# Patient Record
Sex: Female | Born: 1940 | Race: White | Hispanic: No | Smoking: Never smoker
Health system: Southern US, Community
[De-identification: ages and names within clinical notes are randomized; demographics above are authoritative.]

## PROBLEM LIST (undated history)

## (undated) DIAGNOSIS — R0600 Dyspnea, unspecified: Secondary | ICD-10-CM

## (undated) DIAGNOSIS — C801 Malignant (primary) neoplasm, unspecified: Secondary | ICD-10-CM

## (undated) DIAGNOSIS — S065X9A Traumatic subdural hemorrhage with loss of consciousness of unspecified duration, initial encounter: Secondary | ICD-10-CM

## (undated) DIAGNOSIS — R51 Headache: Secondary | ICD-10-CM

## (undated) DIAGNOSIS — G5601 Carpal tunnel syndrome, right upper limb: Secondary | ICD-10-CM

## (undated) DIAGNOSIS — S065XAA Traumatic subdural hemorrhage with loss of consciousness status unknown, initial encounter: Secondary | ICD-10-CM

## (undated) DIAGNOSIS — I809 Phlebitis and thrombophlebitis of unspecified site: Secondary | ICD-10-CM

## (undated) DIAGNOSIS — I872 Venous insufficiency (chronic) (peripheral): Secondary | ICD-10-CM

## (undated) DIAGNOSIS — K219 Gastro-esophageal reflux disease without esophagitis: Secondary | ICD-10-CM

## (undated) DIAGNOSIS — I739 Peripheral vascular disease, unspecified: Secondary | ICD-10-CM

## (undated) DIAGNOSIS — Z9889 Other specified postprocedural states: Secondary | ICD-10-CM

## (undated) DIAGNOSIS — R112 Nausea with vomiting, unspecified: Secondary | ICD-10-CM

## (undated) DIAGNOSIS — N189 Chronic kidney disease, unspecified: Secondary | ICD-10-CM

## (undated) DIAGNOSIS — M1990 Unspecified osteoarthritis, unspecified site: Secondary | ICD-10-CM

## (undated) DIAGNOSIS — I499 Cardiac arrhythmia, unspecified: Secondary | ICD-10-CM

## (undated) DIAGNOSIS — R519 Headache, unspecified: Secondary | ICD-10-CM

## (undated) DIAGNOSIS — H269 Unspecified cataract: Secondary | ICD-10-CM

## (undated) DIAGNOSIS — I493 Ventricular premature depolarization: Secondary | ICD-10-CM

## (undated) DIAGNOSIS — E039 Hypothyroidism, unspecified: Secondary | ICD-10-CM

## (undated) DIAGNOSIS — R7303 Prediabetes: Secondary | ICD-10-CM

## (undated) DIAGNOSIS — G43909 Migraine, unspecified, not intractable, without status migrainosus: Secondary | ICD-10-CM

## (undated) DIAGNOSIS — E785 Hyperlipidemia, unspecified: Secondary | ICD-10-CM

## (undated) DIAGNOSIS — M5090 Cervical disc disorder, unspecified, unspecified cervical region: Secondary | ICD-10-CM

## (undated) DIAGNOSIS — I10 Essential (primary) hypertension: Secondary | ICD-10-CM

## (undated) DIAGNOSIS — D649 Anemia, unspecified: Secondary | ICD-10-CM

## (undated) HISTORY — PX: JOINT REPLACEMENT: SHX530

## (undated) HISTORY — PX: KNEE ARTHROSCOPY: SUR90

## (undated) HISTORY — PX: OTHER SURGICAL HISTORY: SHX169

## (undated) HISTORY — PX: COLONOSCOPY: SHX174

## (undated) HISTORY — PX: DILATION AND CURETTAGE OF UTERUS: SHX78

---
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
## 1998-05-20 DIAGNOSIS — C44300 Unspecified malignant neoplasm of skin of unspecified part of face: Secondary | ICD-10-CM

## 1998-05-20 DIAGNOSIS — C801 Malignant (primary) neoplasm, unspecified: Secondary | ICD-10-CM

## 1998-05-20 HISTORY — DX: Unspecified malignant neoplasm of skin of unspecified part of face: C44.300

## 1998-05-20 HISTORY — DX: Malignant (primary) neoplasm, unspecified: C80.1

## 2004-05-24 ENCOUNTER — Ambulatory Visit: Payer: Self-pay | Admitting: Unknown Physician Specialty

## 2005-05-27 ENCOUNTER — Ambulatory Visit: Payer: Self-pay | Admitting: Unknown Physician Specialty

## 2006-06-11 ENCOUNTER — Ambulatory Visit: Payer: Self-pay | Admitting: Unknown Physician Specialty

## 2007-06-18 ENCOUNTER — Ambulatory Visit: Payer: Self-pay | Admitting: Unknown Physician Specialty

## 2008-06-23 ENCOUNTER — Ambulatory Visit: Payer: Self-pay | Admitting: Unknown Physician Specialty

## 2009-05-20 HISTORY — PX: SUBDURAL HEMATOMA EVACUATION VIA CRANIOTOMY: SUR319

## 2009-06-27 ENCOUNTER — Ambulatory Visit: Payer: Self-pay | Admitting: Unknown Physician Specialty

## 2009-08-18 ENCOUNTER — Encounter (INDEPENDENT_AMBULATORY_CARE_PROVIDER_SITE_OTHER): Payer: Self-pay | Admitting: Neurosurgery

## 2009-08-18 ENCOUNTER — Inpatient Hospital Stay (HOSPITAL_COMMUNITY): Admission: AD | Admit: 2009-08-18 | Discharge: 2009-08-22 | Payer: Self-pay | Admitting: Neurosurgery

## 2009-08-18 ENCOUNTER — Emergency Department: Payer: Self-pay | Admitting: Internal Medicine

## 2009-09-11 ENCOUNTER — Encounter: Admission: RE | Admit: 2009-09-11 | Discharge: 2009-09-11 | Payer: Self-pay | Admitting: Neurosurgery

## 2009-10-24 ENCOUNTER — Encounter: Admission: RE | Admit: 2009-10-24 | Discharge: 2009-10-24 | Payer: Self-pay | Admitting: Neurosurgery

## 2010-07-02 ENCOUNTER — Ambulatory Visit: Payer: Self-pay | Admitting: Unknown Physician Specialty

## 2010-08-08 LAB — CBC
HCT: 34.6 % — ABNORMAL LOW (ref 36.0–46.0)
Hemoglobin: 11.6 g/dL — ABNORMAL LOW (ref 12.0–15.0)
MCHC: 33.5 g/dL (ref 30.0–36.0)
MCV: 89.9 fL (ref 78.0–100.0)
Platelets: 187 10*3/uL (ref 150–400)
RBC: 3.85 MIL/uL — ABNORMAL LOW (ref 3.87–5.11)
RDW: 14.1 % (ref 11.5–15.5)
WBC: 9.3 10*3/uL (ref 4.0–10.5)

## 2010-08-08 LAB — DIFFERENTIAL
Basophils Absolute: 0 10*3/uL (ref 0.0–0.1)
Basophils Relative: 0 % (ref 0–1)
Eosinophils Absolute: 0 10*3/uL (ref 0.0–0.7)
Eosinophils Relative: 0 % (ref 0–5)
Lymphocytes Relative: 5 % — ABNORMAL LOW (ref 12–46)
Lymphs Abs: 0.5 10*3/uL — ABNORMAL LOW (ref 0.7–4.0)
Monocytes Absolute: 0.1 10*3/uL (ref 0.1–1.0)
Monocytes Relative: 1 % — ABNORMAL LOW (ref 3–12)
Neutro Abs: 8.7 10*3/uL — ABNORMAL HIGH (ref 1.7–7.7)
Neutrophils Relative %: 94 % — ABNORMAL HIGH (ref 43–77)

## 2010-08-08 LAB — BASIC METABOLIC PANEL
BUN: 19 mg/dL (ref 6–23)
CO2: 23 mEq/L (ref 19–32)
Calcium: 8.5 mg/dL (ref 8.4–10.5)
Chloride: 110 mEq/L (ref 96–112)
Creatinine, Ser: 0.92 mg/dL (ref 0.4–1.2)
GFR calc Af Amer: >60 mL/min (ref >60)
GFR calc non Af Amer: >60 mL/min (ref >60)
Glucose, Bld: 146 mg/dL — ABNORMAL HIGH (ref 70–99)
Potassium: 4.3 mEq/L (ref 3.5–5.1)
Sodium: 139 mEq/L (ref 135–145)

## 2010-08-08 LAB — GLUCOSE, CAPILLARY: Glucose-Capillary: 119 mg/dL — ABNORMAL HIGH (ref 70–99)

## 2010-08-08 LAB — MRSA PCR SCREENING: MRSA by PCR: NEGATIVE

## 2010-08-08 LAB — TYPE AND SCREEN
ABO/RH(D): O POS
Antibody Screen: NEGATIVE

## 2010-08-08 LAB — ABO/RH: ABO/RH(D): O POS

## 2011-07-08 ENCOUNTER — Ambulatory Visit: Payer: Self-pay | Admitting: Unknown Physician Specialty

## 2012-07-08 ENCOUNTER — Ambulatory Visit: Payer: Self-pay | Admitting: Physician Assistant

## 2012-07-23 ENCOUNTER — Ambulatory Visit: Payer: Self-pay | Admitting: Internal Medicine

## 2012-08-24 ENCOUNTER — Ambulatory Visit: Payer: Self-pay | Admitting: Unknown Physician Specialty

## 2012-08-25 LAB — PATHOLOGY REPORT

## 2013-07-27 ENCOUNTER — Ambulatory Visit: Payer: Self-pay | Admitting: Physician Assistant

## 2014-01-04 DIAGNOSIS — I872 Venous insufficiency (chronic) (peripheral): Secondary | ICD-10-CM | POA: Insufficient documentation

## 2014-01-04 DIAGNOSIS — K219 Gastro-esophageal reflux disease without esophagitis: Secondary | ICD-10-CM | POA: Insufficient documentation

## 2014-01-04 DIAGNOSIS — I10 Essential (primary) hypertension: Secondary | ICD-10-CM | POA: Insufficient documentation

## 2014-01-04 DIAGNOSIS — E782 Mixed hyperlipidemia: Secondary | ICD-10-CM | POA: Insufficient documentation

## 2014-01-04 DIAGNOSIS — E039 Hypothyroidism, unspecified: Secondary | ICD-10-CM | POA: Insufficient documentation

## 2014-01-04 DIAGNOSIS — N289 Disorder of kidney and ureter, unspecified: Secondary | ICD-10-CM | POA: Insufficient documentation

## 2014-01-04 DIAGNOSIS — D649 Anemia, unspecified: Secondary | ICD-10-CM | POA: Insufficient documentation

## 2014-08-02 ENCOUNTER — Ambulatory Visit: Payer: Self-pay | Admitting: Physician Assistant

## 2014-10-14 DIAGNOSIS — M1711 Unilateral primary osteoarthritis, right knee: Secondary | ICD-10-CM | POA: Insufficient documentation

## 2015-05-11 ENCOUNTER — Other Ambulatory Visit: Payer: Self-pay | Admitting: Physician Assistant

## 2015-05-11 DIAGNOSIS — Z1231 Encounter for screening mammogram for malignant neoplasm of breast: Secondary | ICD-10-CM

## 2015-08-03 ENCOUNTER — Ambulatory Visit
Admission: RE | Admit: 2015-08-03 | Discharge: 2015-08-03 | Disposition: A | Discharge status: Home / Self Care | Payer: Medicare Other | Source: Ambulatory Visit | Attending: Physician Assistant | Admitting: Physician Assistant

## 2015-08-03 ENCOUNTER — Other Ambulatory Visit: Payer: Self-pay | Admitting: Physician Assistant

## 2015-08-03 DIAGNOSIS — Z1231 Encounter for screening mammogram for malignant neoplasm of breast: Secondary | ICD-10-CM | POA: Diagnosis present

## 2015-08-03 HISTORY — DX: Malignant (primary) neoplasm, unspecified: C80.1

## 2015-11-28 DIAGNOSIS — R0602 Shortness of breath: Secondary | ICD-10-CM | POA: Insufficient documentation

## 2015-11-28 DIAGNOSIS — I493 Ventricular premature depolarization: Secondary | ICD-10-CM | POA: Insufficient documentation

## 2016-05-16 ENCOUNTER — Other Ambulatory Visit: Payer: Self-pay | Admitting: Physician Assistant

## 2016-05-16 DIAGNOSIS — Z1239 Encounter for other screening for malignant neoplasm of breast: Secondary | ICD-10-CM

## 2016-08-06 ENCOUNTER — Ambulatory Visit
Admission: RE | Admit: 2016-08-06 | Discharge: 2016-08-06 | Disposition: A | Discharge status: Home / Self Care | Payer: Medicare Other | Source: Ambulatory Visit | Attending: Physician Assistant | Admitting: Physician Assistant

## 2016-08-06 ENCOUNTER — Encounter (HOSPITAL_COMMUNITY): Payer: Self-pay

## 2016-08-06 DIAGNOSIS — Z1239 Encounter for other screening for malignant neoplasm of breast: Secondary | ICD-10-CM

## 2016-08-06 DIAGNOSIS — Z1231 Encounter for screening mammogram for malignant neoplasm of breast: Secondary | ICD-10-CM | POA: Diagnosis present

## 2017-05-28 ENCOUNTER — Other Ambulatory Visit: Payer: Self-pay | Admitting: Physician Assistant

## 2017-05-28 DIAGNOSIS — Z1231 Encounter for screening mammogram for malignant neoplasm of breast: Secondary | ICD-10-CM

## 2017-06-20 HISTORY — PX: HERNIA REPAIR: SHX51

## 2017-06-27 ENCOUNTER — Encounter
Admission: RE | Admit: 2017-06-27 | Discharge: 2017-06-27 | Disposition: A | Discharge status: Home / Self Care | Payer: Medicare Other | Source: Ambulatory Visit | Attending: Surgery | Admitting: Surgery

## 2017-06-27 ENCOUNTER — Other Ambulatory Visit: Payer: Self-pay

## 2017-06-27 DIAGNOSIS — Z01810 Encounter for preprocedural cardiovascular examination: Secondary | ICD-10-CM | POA: Diagnosis present

## 2017-06-27 DIAGNOSIS — K219 Gastro-esophageal reflux disease without esophagitis: Secondary | ICD-10-CM | POA: Diagnosis not present

## 2017-06-27 DIAGNOSIS — E785 Hyperlipidemia, unspecified: Secondary | ICD-10-CM | POA: Insufficient documentation

## 2017-06-27 DIAGNOSIS — D649 Anemia, unspecified: Secondary | ICD-10-CM | POA: Insufficient documentation

## 2017-06-27 DIAGNOSIS — I10 Essential (primary) hypertension: Secondary | ICD-10-CM | POA: Insufficient documentation

## 2017-06-27 DIAGNOSIS — Z01812 Encounter for preprocedural laboratory examination: Secondary | ICD-10-CM | POA: Diagnosis not present

## 2017-06-27 DIAGNOSIS — E039 Hypothyroidism, unspecified: Secondary | ICD-10-CM | POA: Insufficient documentation

## 2017-06-27 DIAGNOSIS — Z85828 Personal history of other malignant neoplasm of skin: Secondary | ICD-10-CM | POA: Insufficient documentation

## 2017-06-27 DIAGNOSIS — I739 Peripheral vascular disease, unspecified: Secondary | ICD-10-CM | POA: Insufficient documentation

## 2017-06-27 DIAGNOSIS — M542 Cervicalgia: Secondary | ICD-10-CM | POA: Diagnosis not present

## 2017-06-27 HISTORY — DX: Anemia, unspecified: D64.9

## 2017-06-27 HISTORY — DX: Essential (primary) hypertension: I10

## 2017-06-27 HISTORY — DX: Other specified postprocedural states: Z98.890

## 2017-06-27 HISTORY — DX: Hypothyroidism, unspecified: E03.9

## 2017-06-27 HISTORY — DX: Unspecified osteoarthritis, unspecified site: M19.90

## 2017-06-27 HISTORY — DX: Cervical disc disorder, unspecified, unspecified cervical region: M50.90

## 2017-06-27 HISTORY — DX: Peripheral vascular disease, unspecified: I73.9

## 2017-06-27 HISTORY — DX: Nausea with vomiting, unspecified: R11.2

## 2017-06-27 HISTORY — DX: Hyperlipidemia, unspecified: E78.5

## 2017-06-27 HISTORY — DX: Venous insufficiency (chronic) (peripheral): I87.2

## 2017-06-27 HISTORY — DX: Gastro-esophageal reflux disease without esophagitis: K21.9

## 2017-06-27 LAB — POTASSIUM: Potassium: 3.9 mmol/L (ref 3.5–5.1)

## 2017-06-27 NOTE — Progress Notes (Signed)
EKG of 06/27/17 reviewed by Dr. Kayleen Memos (anesthesia). Recommends getting medical clearance due to PVC's showing on EKG. Clearance form faxed to Dr. Carrie Mew (patient's PCP on file)

## 2017-06-27 NOTE — Patient Instructions (Signed)
Your procedure is scheduled on: July 11, 2017 FRIDAY Report to Same Day Surgery on the 2nd floor in the [facility]. To find out your arrival time, please call [phone] between 1PM - 3PM on: July 10, 2017 THURSDAY  REMEMBER: Instructions that are not followed completely may result in serious medical risk, up to and including death; or upon the discretion of your surgeon and anesthesiologist your surgery may need to be rescheduled.  Do not eat food after midnight the night before your procedure.  No gum chewing or hard candies.  You may however, drink CLEAR liquids up to 2 hours before you are scheduled to arrive at the hospital for your procedure.  Do not drink clear liquids within 2 hours of the start of your surgery.  Clear liquids include: - water  - apple juice without pulp - clear gatorade - black coffee or tea (Do NOT add anything to the coffee or tea) Do NOT drink anything that is not on this list.  No Smoking including e-cigarettes for 24 hours prior to surgery. No chewable tobacco products for at least 6 hours prior to surgery. No nicotine patches on the day of surgery.  On the morning of surgery brush your teeth with toothpaste and water, you may rinse your mouth with mouthwash if you wish. Do not swallow any  toothpaste of mouthwash.  Notify your doctor if there is any change in your medical condition (cold, fever, infection).  Do not wear jewelry, make-up, hairpins, clips or nail polish.  Do not wear lotions, powders, or perfumes. You may NOT wear deodorant.  Do not shave 48 hours prior to surgery. Men may shave face and neck.  Contacts and dentures may not be worn into surgery.  Do not bring valuables to the hospital. [facility] is not responsible for any belongings or valuables.   TAKE THESE MEDICATIONS THE MORNING OF SURGERY WITH A SIP OF WATER: SYNTHROID OMEPRAZOLE TAKE A DOSE  THE NIGHT BEFORE SURGERY AND THE MORNING OF SURGERY  Use CHG Soap   as directed on instruction sheet.  Follow recommendations from Cardiologist, Pulmonologist or PCP regarding stopping Aspirin, Coumadin, Plavix, Eliquis, Pradaxa, or Pletal.-LAST DOSE 07-03-2017 PER DR [facility] OFFICE  Stop Anti-inflammatories such as Advil, Aleve, Ibuprofen, Motrin, Naproxen, Naprosyn, Goodie powder, or aspirin products. (May take Tylenol or Acetaminophen if needed.) LAST DOSE 2-19- 2019 PER DR SMITH  Stop ANY OVER THE COUNTER supplements until after surgery.LAST DOSE 2-14 2019 (May continue Vitamin D, Vitamin B, and multivitamin.)  If you are being discharged the day of surgery, you will not be allowed to drive home. You will need someone to drive you home and stay with you that night.   If you are taking public transportation, you will need to have a responsible adult to with you.  Please call the number above if you have any questions about these instructions.

## 2017-07-10 MED ORDER — CEFAZOLIN SODIUM-DEXTROSE 2-4 GM/100ML-% IV SOLN
2.0000 g | Freq: Once | INTRAVENOUS | Status: AC
  Administered 2017-07-11: 2 g via INTRAVENOUS

## 2017-07-11 ENCOUNTER — Ambulatory Visit: Payer: Medicare Other | Admitting: Anesthesiology

## 2017-07-11 ENCOUNTER — Ambulatory Visit
Admission: RE | Admit: 2017-07-11 | Discharge: 2017-07-11 | Disposition: A | Discharge status: Home / Self Care | Payer: Medicare Other | Source: Ambulatory Visit | Attending: Surgery | Admitting: Surgery

## 2017-07-11 ENCOUNTER — Encounter: Payer: Self-pay | Admitting: *Deleted

## 2017-07-11 ENCOUNTER — Encounter
Admission: RE | Disposition: A | Discharge status: Home / Self Care | Payer: Self-pay | Source: Ambulatory Visit | Attending: Surgery

## 2017-07-11 DIAGNOSIS — I10 Essential (primary) hypertension: Secondary | ICD-10-CM | POA: Insufficient documentation

## 2017-07-11 DIAGNOSIS — E039 Hypothyroidism, unspecified: Secondary | ICD-10-CM | POA: Insufficient documentation

## 2017-07-11 DIAGNOSIS — Z7982 Long term (current) use of aspirin: Secondary | ICD-10-CM | POA: Insufficient documentation

## 2017-07-11 DIAGNOSIS — I739 Peripheral vascular disease, unspecified: Secondary | ICD-10-CM | POA: Diagnosis not present

## 2017-07-11 DIAGNOSIS — K4090 Unilateral inguinal hernia, without obstruction or gangrene, not specified as recurrent: Secondary | ICD-10-CM | POA: Diagnosis present

## 2017-07-11 DIAGNOSIS — Z79899 Other long term (current) drug therapy: Secondary | ICD-10-CM | POA: Insufficient documentation

## 2017-07-11 DIAGNOSIS — Z85828 Personal history of other malignant neoplasm of skin: Secondary | ICD-10-CM | POA: Insufficient documentation

## 2017-07-11 DIAGNOSIS — K219 Gastro-esophageal reflux disease without esophagitis: Secondary | ICD-10-CM | POA: Diagnosis not present

## 2017-07-11 DIAGNOSIS — E785 Hyperlipidemia, unspecified: Secondary | ICD-10-CM | POA: Insufficient documentation

## 2017-07-11 HISTORY — PX: INGUINAL HERNIA REPAIR: SHX194

## 2017-07-11 SURGERY — REPAIR, HERNIA, INGUINAL, ADULT
Anesthesia: General | Site: Abdomen | Laterality: Right | Wound class: Clean

## 2017-07-11 MED ORDER — DEXAMETHASONE SODIUM PHOSPHATE 10 MG/ML IJ SOLN
INTRAMUSCULAR | Status: DC | PRN
  Administered 2017-07-11: 10 mg via INTRAVENOUS

## 2017-07-11 MED ORDER — ONDANSETRON HCL 4 MG/2ML IJ SOLN
INTRAMUSCULAR | Status: AC
  Filled 2017-07-11: qty 2

## 2017-07-11 MED ORDER — CEFAZOLIN SODIUM-DEXTROSE 2-4 GM/100ML-% IV SOLN
INTRAVENOUS | Status: AC
  Filled 2017-07-11: qty 100

## 2017-07-11 MED ORDER — ROCURONIUM BROMIDE 50 MG/5ML IV SOLN
INTRAVENOUS | Status: AC
  Filled 2017-07-11: qty 1

## 2017-07-11 MED ORDER — PROPOFOL 10 MG/ML IV BOLUS
INTRAVENOUS | Status: AC
  Filled 2017-07-11: qty 20

## 2017-07-11 MED ORDER — SODIUM CHLORIDE FLUSH 0.9 % IV SOLN
INTRAVENOUS | Status: AC
  Filled 2017-07-11: qty 10

## 2017-07-11 MED ORDER — BUPIVACAINE-EPINEPHRINE (PF) 0.5% -1:200000 IJ SOLN
INTRAMUSCULAR | Status: AC
  Filled 2017-07-11: qty 30

## 2017-07-11 MED ORDER — PROPOFOL 10 MG/ML IV BOLUS
INTRAVENOUS | Status: DC | PRN
  Administered 2017-07-11: 120 mg via INTRAVENOUS

## 2017-07-11 MED ORDER — LACTATED RINGERS IV SOLN
INTRAVENOUS | Status: DC | PRN
  Administered 2017-07-11: 10:00:00 via INTRAVENOUS

## 2017-07-11 MED ORDER — MIDAZOLAM HCL 2 MG/2ML IJ SOLN
INTRAMUSCULAR | Status: DC | PRN
  Administered 2017-07-11: 1 mg via INTRAVENOUS

## 2017-07-11 MED ORDER — HYDROCODONE-ACETAMINOPHEN 5-325 MG PO TABS: 1.0000 | ORAL_TABLET | Freq: Four times a day (QID) | ORAL | 0 refills | Status: DC | PRN

## 2017-07-11 MED ORDER — ARTIFICIAL TEARS OPHTHALMIC OINT
TOPICAL_OINTMENT | OPHTHALMIC | Status: DC | PRN
  Administered 2017-07-11: 1 via OPHTHALMIC

## 2017-07-11 MED ORDER — LIDOCAINE HCL (CARDIAC) 20 MG/ML IV SOLN
INTRAVENOUS | Status: DC | PRN
  Administered 2017-07-11: 80 mg via INTRAVENOUS

## 2017-07-11 MED ORDER — ROCURONIUM BROMIDE 100 MG/10ML IV SOLN
INTRAVENOUS | Status: DC | PRN
  Administered 2017-07-11: 10 mg via INTRAVENOUS
  Administered 2017-07-11: 40 mg via INTRAVENOUS

## 2017-07-11 MED ORDER — ARTIFICIAL TEARS OPHTHALMIC OINT
TOPICAL_OINTMENT | OPHTHALMIC | Status: AC
  Filled 2017-07-11: qty 3.5

## 2017-07-11 MED ORDER — MIDAZOLAM HCL 2 MG/2ML IJ SOLN
INTRAMUSCULAR | Status: AC
  Filled 2017-07-11: qty 2

## 2017-07-11 MED ORDER — FENTANYL CITRATE (PF) 100 MCG/2ML IJ SOLN
INTRAMUSCULAR | Status: DC | PRN
  Administered 2017-07-11: 25 ug via INTRAVENOUS
  Administered 2017-07-11: 50 ug via INTRAVENOUS
  Administered 2017-07-11: 25 ug via INTRAVENOUS

## 2017-07-11 MED ORDER — DEXAMETHASONE SODIUM PHOSPHATE 10 MG/ML IJ SOLN
INTRAMUSCULAR | Status: AC
  Filled 2017-07-11: qty 1

## 2017-07-11 MED ORDER — FENTANYL CITRATE (PF) 100 MCG/2ML IJ SOLN
25.0000 ug | INTRAMUSCULAR | Status: DC | PRN
  Administered 2017-07-11 (×3): 25 ug via INTRAVENOUS

## 2017-07-11 MED ORDER — BUPIVACAINE-EPINEPHRINE (PF) 0.5% -1:200000 IJ SOLN
INTRAMUSCULAR | Status: DC | PRN
  Administered 2017-07-11: 15 mL via PERINEURAL

## 2017-07-11 MED ORDER — PROMETHAZINE HCL 25 MG/ML IJ SOLN
6.2500 mg | INTRAMUSCULAR | Status: DC | PRN
  Administered 2017-07-11: 12.5 mg via INTRAVENOUS

## 2017-07-11 MED ORDER — KETOROLAC TROMETHAMINE 30 MG/ML IJ SOLN
INTRAMUSCULAR | Status: AC
  Filled 2017-07-11: qty 1

## 2017-07-11 MED ORDER — OXYCODONE HCL 5 MG/5ML PO SOLN: 5.0000 mg | Freq: Once | ORAL | Status: DC | PRN

## 2017-07-11 MED ORDER — OXYCODONE HCL 5 MG PO TABS: 5.0000 mg | ORAL_TABLET | Freq: Once | ORAL | Status: DC | PRN

## 2017-07-11 MED ORDER — LIDOCAINE HCL (PF) 2 % IJ SOLN
INTRAMUSCULAR | Status: AC
  Filled 2017-07-11: qty 10

## 2017-07-11 MED ORDER — FENTANYL CITRATE (PF) 100 MCG/2ML IJ SOLN
INTRAMUSCULAR | Status: AC
  Administered 2017-07-11: 25 ug via INTRAVENOUS
  Filled 2017-07-11: qty 2

## 2017-07-11 MED ORDER — MEPERIDINE HCL 50 MG/ML IJ SOLN: 6.2500 mg | INTRAMUSCULAR | Status: DC | PRN

## 2017-07-11 MED ORDER — SUGAMMADEX SODIUM 500 MG/5ML IV SOLN
INTRAVENOUS | Status: DC | PRN
  Administered 2017-07-11: 144.2 mg via INTRAVENOUS

## 2017-07-11 MED ORDER — PROMETHAZINE HCL 25 MG/ML IJ SOLN
INTRAMUSCULAR | Status: AC
  Administered 2017-07-11: 12.5 mg via INTRAVENOUS
  Filled 2017-07-11: qty 1

## 2017-07-11 MED ORDER — LACTATED RINGERS IV SOLN: INTRAVENOUS | Status: DC

## 2017-07-11 MED ORDER — SUGAMMADEX SODIUM 200 MG/2ML IV SOLN
INTRAVENOUS | Status: AC
  Filled 2017-07-11: qty 2

## 2017-07-11 MED ORDER — ONDANSETRON HCL 4 MG/2ML IJ SOLN
INTRAMUSCULAR | Status: DC | PRN
  Administered 2017-07-11: 4 mg via INTRAVENOUS

## 2017-07-11 MED ORDER — FENTANYL CITRATE (PF) 100 MCG/2ML IJ SOLN
INTRAMUSCULAR | Status: AC
  Filled 2017-07-11: qty 2

## 2017-07-11 SURGICAL SUPPLY — 31 items
BLADE CLIPPER SURG (BLADE) ×3 IMPLANT
BLADE SURG 15 STRL LF DISP TIS (BLADE) ×1 IMPLANT
BLADE SURG 15 STRL SS (BLADE) ×2
CANISTER SUCT 1200ML W/VALVE (MISCELLANEOUS) ×3 IMPLANT
CHLORAPREP W/TINT 26ML (MISCELLANEOUS) ×3 IMPLANT
DERMABOND ADVANCED (GAUZE/BANDAGES/DRESSINGS) ×2
DERMABOND ADVANCED .7 DNX12 (GAUZE/BANDAGES/DRESSINGS) ×1 IMPLANT
DRAIN PENROSE 5/8X18 LTX STRL (WOUND CARE) ×3 IMPLANT
DRAPE LAPAROTOMY 77X122 PED (DRAPES) ×3 IMPLANT
DRAPE LAPAROTOMY TRNSV 106X77 (MISCELLANEOUS) IMPLANT
ELECT REM PT RETURN 9FT ADLT (ELECTROSURGICAL) ×3
ELECTRODE REM PT RTRN 9FT ADLT (ELECTROSURGICAL) ×1 IMPLANT
GLOVE BIO SURGEON STRL SZ7 (GLOVE) ×6 IMPLANT
GLOVE BIO SURGEON STRL SZ7.5 (GLOVE) ×3 IMPLANT
GLOVE BIOGEL PI IND STRL 7.0 (GLOVE) ×1 IMPLANT
GLOVE BIOGEL PI INDICATOR 7.0 (GLOVE) ×2
GOWN STRL REUS W/ TWL LRG LVL3 (GOWN DISPOSABLE) ×3 IMPLANT
GOWN STRL REUS W/TWL LRG LVL3 (GOWN DISPOSABLE) ×6
KIT TURNOVER KIT A (KITS) ×3 IMPLANT
LABEL OR SOLS (LABEL) IMPLANT
MESH SYNTHETIC 4X6 SOFT BARD (Mesh General) ×1 IMPLANT
MESH SYNTHETIC SOFT BARD 4X6 (Mesh General) ×2 IMPLANT
NEEDLE HYPO 25X1 1.5 SAFETY (NEEDLE) ×3 IMPLANT
NS IRRIG 500ML POUR BTL (IV SOLUTION) ×3 IMPLANT
PACK BASIN MINOR ARMC (MISCELLANEOUS) ×3 IMPLANT
SUT CHROMIC 4 0 RB 1X27 (SUTURE) ×3 IMPLANT
SUT MNCRL AB 4-0 PS2 18 (SUTURE) ×3 IMPLANT
SUT SURGILON 0 30 BLK (SUTURE) ×9 IMPLANT
SUT VIC AB 4-0 SH 27 (SUTURE) ×2
SUT VIC AB 4-0 SH 27XANBCTRL (SUTURE) ×1 IMPLANT
SYR 10ML LL (SYRINGE) ×3 IMPLANT

## 2017-07-11 NOTE — Transfer of Care (Signed)
Immediate Anesthesia Transfer of Care Note  Patient: Pamela Conley  Procedure(s) Performed: HERNIA REPAIR INGUINAL ADULT (Right Abdomen)  Patient Location: PACU  Anesthesia Type:General  Level of Consciousness: sedated  Airway & Oxygen Therapy: Patient Spontanous Breathing and Patient connected to face mask oxygen  Post-op Assessment: Report given to RN and Post -op Vital signs reviewed and stable  Post vital signs: Reviewed and stable  Last Vitals:  Vitals:   07/11/17 0937  BP: (!) 168/84  Pulse: 72  Resp: 18  Temp: 36.6 C  SpO2: 100%    Last Pain:  Vitals:   07/11/17 0937  TempSrc: Oral         Complications: No apparent anesthesia complications

## 2017-07-11 NOTE — Op Note (Signed)
OPERATIVE REPORT  PREOPERATIVE DIAGNOSIS: Right inguinal hernia  POSTOPERATIVE DIAGNOSIS: Right inguinal hernia  PROCEDURE: Right inguinal hernia repair  ANESTHESIA:  General  SURGEON:  Rochel Brome M.D.  INDICATIONS: She reports bulging in the right groin.  A right inguinal hernia was demonstrated on physical exam and repair was recommended for definitive treatment.  With the patient on the operating table in the supine position the right lower quadrant was prepared with clippers and with ChloraPrep and draped in a sterile manner. A transversely oriented suprapubic incision was made and carried down through subcutaneous tissues. Electrocautery was used for hemostasis. The Scarpa's fascia was incised. The external oblique aponeurosis was incised along the course of its fibers to open the external ring.  A hernia sac was encountered and dissected free from surrounding structures.  There was a large defect in the floor of the inguinal canal.  There was a finding of pantaloon hernia with sac medial and lateral to the inferior epigastric vessels.  The portion of the sac that was lateral to the inferior epigastric vessels was delivered under the vessels and brought out medial to the vessels.  Attenuated transversalis fascia was excised.  The sac was opened.  Its continuity with the peritoneal cavity was demonstrated.  A high ligation of the sac was done with a 0 Surgilon suture ligature.  The sac was excised and was not submitted for pathology.  The stump was allowed to retract.  The repair was carried out with 0 Surgilon suturing the conjoined tendon to the shelving edge of the inguinal ligament incorporating transversalis fascia into the repair.  The last stitch obliterated the internal ring. Bard soft mesh was cut to create an oval shape and was placed over the repair. This was sutured to the repair with interrupted 0 Surgilon sutures and also sutured medially to the deep fascia.   The cut edges of the  external oblique aponeurosis were closed with a running 4-0 Vicryl suture.  The deep fascia superior and lateral to the repair site was infiltrated with half percent Sensorcaine with epinephrine. Subcutaneous tissues were also infiltrated. The Scarpa's fascia was closed with interrupted 4-0 Vicryl sutures. The skin was closed with running 4-0 Monocryl subcuticular suture and LiquiBand. The testicle remained in the scrotum  The patient appeared to be in satisfactory condition and was prepared for transfer to the recovery room.  Rochel Brome M.D.

## 2017-07-11 NOTE — Anesthesia Post-op Follow-up Note (Signed)
Anesthesia QCDR form completed.        

## 2017-07-11 NOTE — Anesthesia Postprocedure Evaluation (Signed)
Anesthesia Post Note  Patient: Pamela Conley  Procedure(s) Performed: HERNIA REPAIR INGUINAL ADULT (Right Abdomen)  Patient location during evaluation: PACU Anesthesia Type: General Level of consciousness: awake and alert and oriented Pain management: pain level controlled Vital Signs Assessment: post-procedure vital signs reviewed and stable Respiratory status: spontaneous breathing, nonlabored ventilation and respiratory function stable Cardiovascular status: blood pressure returned to baseline and stable Postop Assessment: no signs of nausea or vomiting Anesthetic complications: no     Last Vitals:  Vitals:   07/11/17 1323 07/11/17 1414  BP: (!) 159/84 134/65  Pulse: 69 66  Resp: 18   Temp: 37.4 C   SpO2: 98% 99%    Last Pain:  Vitals:   07/11/17 1414  TempSrc:   PainSc: 3                  Talon Regala

## 2017-07-11 NOTE — H&P (Signed)
Pamela Conley is an 77 y.o. female.   Chief Complaint: Bulging in the right groin HPI: She reports a history of bulging in the right groin which was noticed when she was standing up but generally resolves when she lies down.  She was seen in the office on January 6 and scheduled for surgery.  She reports no pain at the site.  Past Medical History:  Diagnosis Date  . Anemia   . Arthritis   . Cancer ([place])    skin  . 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 GERD (gastroesophageal reflux disease)   . Hyperlipidemia   . Hypertension   . Hypothyroidism   . Peripheral vascular disease ([place])   . PONV (postoperative nausea and vomiting)   . Venous (peripheral) insufficiency   Does have history of mild mitral regurgitation and mild tricuspid regurgitation.  Past Surgical History:  Procedure Laterality Date  . APPENDECTOMY    . COLONOSCOPY    . DILATION AND CURETTAGE OF UTERUS    . edg    . KNEE ARTHROSCOPY Right   . skin cancer removed from face    . SUBDURAL HEMATOMA EVACUATION VIA CRANIOTOMY      Family History  Problem Relation Age of Onset  . Breast cancer Neg Hx    Social History:  reports that  has never smoked. she has never used smokeless tobacco. She reports that she does not drink alcohol or use drugs.  Allergies:  Allergies  Allergen Reactions  . Esomeprazole Magnesium Hives    Medications Prior to Admission  Medication Sig Dispense Refill  . acetaminophen (TYLENOL) 500 MG tablet Take 500 mg by mouth every 6 (six) hours as needed for moderate pain or headache.    Marland Kitchen aspirin EC 81 MG tablet Take 81 mg by mouth daily.    . Cholecalciferol (VITAMIN D3) 1000 units CAPS Take 1,000 Units by mouth 2 (two) times daily.    Marland Kitchen etodolac (LODINE) 400 MG tablet Take 400 mg by mouth daily.    Marland Kitchen levothyroxine (SYNTHROID, LEVOTHROID) 75 MCG tablet Take 75 mcg by mouth daily before breakfast.    . Multiple Vitamins-Calcium (ONE-A-DAY WOMENS PO) Take 1 tablet by mouth  daily.    Marland Kitchen omeprazole (PRILOSEC) 20 MG capsule Take 20 mg by mouth daily.    Vladimir Faster Glycol-Propyl Glycol (SYSTANE OP) Place 1 drop into both eyes daily.    Vladimir Faster Glycol-Propyl Glycol (SYSTANE) 0.4-0.3 % GEL ophthalmic gel Place 1 application into both eyes at bedtime.    . ramipril (ALTACE) 2.5 MG capsule Take 2.5 mg by mouth daily.    Marland Kitchen triamterene-hydrochlorothiazide (MAXZIDE-25) 37.5-25 MG tablet Take 1 tablet by mouth daily.    . Calcium Carb-Cholecalciferol (CALCIUM-VITAMIN D) 500-200 MG-UNIT tablet Take 1 tablet by mouth 2 (two) times daily.    . Probiotic Product (PROBIOTIC DAILY PO) Take 1 capsule by mouth daily.    She has been off her aspirin since February 14  ROS she reports no recent acute illness such as cough cold or sore throat.  She does have dry eyes and uses eyedrops.  She reports no swollen glands, no heartburn.  She reports no dyspnea on exertion, no chest pains.  She reports occasional mild degree of swelling in her ankles but none in recent days.  Review of systems otherwise negative.  Blood pressure (!) 168/84, pulse 72, temperature 97.8 F (36.6 C), temperature source Oral, resp. rate 18, height 5\' 4"  (1.626 m), weight  72.1 kg (159 lb), SpO2 100 %. Physical Exam GENERAL: She is awake alert and oriented resting on the stretcher.  HEENT: Pupils equal reactive to light, sclera clear, extraocular movements intact.  She does frequently blink.  Pharynx clear.  NECK: With no palpable adenopathy and no jugular venous distention.  CARDIAC: Normal S1-S2 without murmur with occasional premature contraction  LUNGS: Clear to auscultation with no rales rhonchi or wheezes.  ABDOMEN: Soft flat nontender.  The right inguinal hernia is currently reduced.  EXTREMITIES: With no dependent edema  NEUROLOGICAL: Awake alert oriented and moving all extremities.  CLINICAL DATA: She had metabolic panel C which was normal with a creatinine of 0.8 and a total bilirubin of 0.6.   CBC was normal with hemoglobin of 13.1 and platelet count 198,000.   Assessment/Plan Right inguinal hernia  I discussed right inguinal hernia repair discussed the operation and care risk and benefits.  Plan preoperative prophylactic Kefzol.  Rochel Brome, MD 07/11/2017, 10:10 AM

## 2017-07-11 NOTE — Anesthesia Preprocedure Evaluation (Signed)
Anesthesia Evaluation  Patient identified by MRN, date of birth, ID band Patient awake    Reviewed: Allergy & Precautions, NPO status , Patient's Chart, lab work & pertinent test results  History of Anesthesia Complications (+) PONV and history of anesthetic complications  Airway Mallampati: III  TM Distance: >3 FB Neck ROM: Full  Mouth opening: Limited Mouth Opening  Dental no notable dental hx.    Pulmonary neg pulmonary ROS, neg sleep apnea, neg COPD,    breath sounds clear to auscultation- rhonchi (-) wheezing      Cardiovascular hypertension, Pt. on medications (-) CAD, (-) Past MI and (-) Cardiac Stents  Rhythm:Regular Rate:Normal - Systolic murmurs and - Diastolic murmurs    Neuro/Psych Hx of subdural hematoma requiring surgical evacuation negative psych ROS   GI/Hepatic Neg liver ROS, GERD  ,  Endo/Other  neg diabetesHypothyroidism   Renal/GU negative Renal ROS     Musculoskeletal  (+) Arthritis ,   Abdominal (+) - obese,   Peds  Hematology  (+) anemia ,   Anesthesia Other Findings Past Medical History: No date: Anemia No date: Arthritis No date: Cancer ([place])     Comment:  skin No date: 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 No date: GERD (gastroesophageal reflux disease) No date: Hyperlipidemia No date: Hypertension No date: Hypothyroidism No date: Peripheral vascular disease ([facility]) No date: PONV (postoperative nausea and vomiting) No date: Venous (peripheral) insufficiency   Reproductive/Obstetrics                             Anesthesia Physical Anesthesia Plan  ASA: III  Anesthesia Plan: General   Post-op Pain Management:    Induction: Intravenous  PONV Risk Score and Plan: 3 and Ondansetron, Dexamethasone and Midazolam  Airway Management Planned: Oral ETT  Additional Equipment:   Intra-op Plan:   Post-operative Plan: Extubation in OR  Informed  Consent: I have reviewed the patients History and Physical, chart, labs and discussed the procedure including the risks, benefits and alternatives for the proposed anesthesia with the patient or authorized representative who has indicated his/her understanding and acceptance.   Dental advisory given  Plan Discussed with: CRNA and Anesthesiologist  Anesthesia Plan Comments:         Anesthesia Quick Evaluation

## 2017-07-11 NOTE — Progress Notes (Signed)
Dr. Tamala Julian into see

## 2017-07-11 NOTE — Discharge Instructions (Addendum)
Take Tylenol or Norco if needed for pain. ° °Should not drive or do anything dangerous when taking Norco. ° °May shower and blot dry. ° °Avoid straining and heavy lifting. ° °AMBULATORY SURGERY  °DISCHARGE INSTRUCTIONS ° ° °1) The drugs that you were given will stay in your system until tomorrow so for the next 24 hours you should not: ° °A) Drive an automobile °B) Make any legal decisions °C) Drink any alcoholic beverage ° ° °2) You may resume regular meals tomorrow.  Today it is better to start with liquids and gradually work up to solid foods. ° °You may eat anything you prefer, but it is better to start with liquids, then soup and crackers, and gradually work up to solid foods. ° ° °3) Please notify your doctor immediately if you have any unusual bleeding, trouble breathing, redness and pain at the surgery site, drainage, fever, or pain not relieved by medication. ° °4) Additional Instructions: ° ° °Please contact your physician with any problems or Same Day Surgery at [phone], Monday through Friday 6 am to 4 pm, or [place] at [place] Main number at [phone]. °

## 2017-07-11 NOTE — Anesthesia Procedure Notes (Signed)
Procedure Name: Intubation Date/Time: 07/11/2017 10:27 AM Performed by: Carron Curie, CRNA Pre-anesthesia Checklist: Patient identified, Emergency Drugs available, Suction available, Patient being monitored and Timeout performed Patient Re-evaluated:Patient Re-evaluated prior to induction Oxygen Delivery Method: Circle system utilized Preoxygenation: Pre-oxygenation with 100% oxygen Induction Type: IV induction Ventilation: Mask ventilation without difficulty Laryngoscope Size: Mac and 3 Grade View: Grade III Tube type: Oral Number of attempts: 1 Airway Equipment and Method: Stylet

## 2017-08-20 ENCOUNTER — Ambulatory Visit
Admission: RE | Admit: 2017-08-20 | Discharge: 2017-08-20 | Disposition: A | Discharge status: Home / Self Care | Payer: Medicare Other | Source: Ambulatory Visit | Attending: Physician Assistant | Admitting: Physician Assistant

## 2017-08-20 DIAGNOSIS — Z1231 Encounter for screening mammogram for malignant neoplasm of breast: Secondary | ICD-10-CM | POA: Insufficient documentation

## 2017-09-03 ENCOUNTER — Other Ambulatory Visit: Payer: Self-pay | Admitting: Nurse Practitioner

## 2017-09-03 DIAGNOSIS — R140 Abdominal distension (gaseous): Secondary | ICD-10-CM

## 2017-09-03 DIAGNOSIS — R143 Flatulence: Secondary | ICD-10-CM

## 2017-09-05 ENCOUNTER — Ambulatory Visit: Payer: Medicare Other

## 2017-09-23 ENCOUNTER — Encounter: Payer: Self-pay | Admitting: *Deleted

## 2017-09-24 ENCOUNTER — Ambulatory Visit: Payer: Medicare Other | Admitting: Anesthesiology

## 2017-09-24 ENCOUNTER — Encounter
Admission: RE | Disposition: A | Discharge status: Home / Self Care | Payer: Self-pay | Source: Ambulatory Visit | Attending: Unknown Physician Specialty

## 2017-09-24 ENCOUNTER — Ambulatory Visit
Admission: RE | Admit: 2017-09-24 | Discharge: 2017-09-24 | Disposition: A | Discharge status: Home / Self Care | Payer: Medicare Other | Source: Ambulatory Visit | Attending: Unknown Physician Specialty | Admitting: Unknown Physician Specialty

## 2017-09-24 ENCOUNTER — Encounter: Payer: Self-pay | Admitting: *Deleted

## 2017-09-24 DIAGNOSIS — I129 Hypertensive chronic kidney disease with stage 1 through stage 4 chronic kidney disease, or unspecified chronic kidney disease: Secondary | ICD-10-CM | POA: Diagnosis not present

## 2017-09-24 DIAGNOSIS — K219 Gastro-esophageal reflux disease without esophagitis: Secondary | ICD-10-CM | POA: Insufficient documentation

## 2017-09-24 DIAGNOSIS — M1990 Unspecified osteoarthritis, unspecified site: Secondary | ICD-10-CM | POA: Diagnosis not present

## 2017-09-24 DIAGNOSIS — Z888 Allergy status to other drugs, medicaments and biological substances status: Secondary | ICD-10-CM | POA: Insufficient documentation

## 2017-09-24 DIAGNOSIS — I739 Peripheral vascular disease, unspecified: Secondary | ICD-10-CM | POA: Insufficient documentation

## 2017-09-24 DIAGNOSIS — D120 Benign neoplasm of cecum: Secondary | ICD-10-CM | POA: Insufficient documentation

## 2017-09-24 DIAGNOSIS — Z79899 Other long term (current) drug therapy: Secondary | ICD-10-CM | POA: Insufficient documentation

## 2017-09-24 DIAGNOSIS — N189 Chronic kidney disease, unspecified: Secondary | ICD-10-CM | POA: Insufficient documentation

## 2017-09-24 DIAGNOSIS — Z7982 Long term (current) use of aspirin: Secondary | ICD-10-CM | POA: Insufficient documentation

## 2017-09-24 DIAGNOSIS — E039 Hypothyroidism, unspecified: Secondary | ICD-10-CM | POA: Diagnosis not present

## 2017-09-24 DIAGNOSIS — Z8371 Family history of colonic polyps: Secondary | ICD-10-CM | POA: Diagnosis not present

## 2017-09-24 DIAGNOSIS — K5730 Diverticulosis of large intestine without perforation or abscess without bleeding: Secondary | ICD-10-CM | POA: Diagnosis not present

## 2017-09-24 DIAGNOSIS — K640 First degree hemorrhoids: Secondary | ICD-10-CM | POA: Insufficient documentation

## 2017-09-24 DIAGNOSIS — Z85828 Personal history of other malignant neoplasm of skin: Secondary | ICD-10-CM | POA: Insufficient documentation

## 2017-09-24 DIAGNOSIS — E785 Hyperlipidemia, unspecified: Secondary | ICD-10-CM | POA: Diagnosis not present

## 2017-09-24 DIAGNOSIS — I872 Venous insufficiency (chronic) (peripheral): Secondary | ICD-10-CM | POA: Diagnosis not present

## 2017-09-24 DIAGNOSIS — Z1211 Encounter for screening for malignant neoplasm of colon: Secondary | ICD-10-CM | POA: Insufficient documentation

## 2017-09-24 HISTORY — PX: COLONOSCOPY WITH PROPOFOL: SHX5780

## 2017-09-24 HISTORY — DX: Chronic kidney disease, unspecified: N18.9

## 2017-09-24 HISTORY — DX: Phlebitis and thrombophlebitis of unspecified site: I80.9

## 2017-09-24 HISTORY — DX: Venous insufficiency (chronic) (peripheral): I87.2

## 2017-09-24 HISTORY — DX: Traumatic subdural hemorrhage with loss of consciousness of unspecified duration, initial encounter: S06.5X9A

## 2017-09-24 HISTORY — DX: Traumatic subdural hemorrhage with loss of consciousness status unknown, initial encounter: S06.5XAA

## 2017-09-24 SURGERY — COLONOSCOPY WITH PROPOFOL
Anesthesia: General

## 2017-09-24 MED ORDER — PROPOFOL 500 MG/50ML IV EMUL
INTRAVENOUS | Status: AC
  Filled 2017-09-24: qty 50

## 2017-09-24 MED ORDER — LIDOCAINE HCL (PF) 2 % IJ SOLN
INTRAMUSCULAR | Status: AC
  Filled 2017-09-24: qty 10

## 2017-09-24 MED ORDER — SODIUM CHLORIDE 0.9 % IV SOLN: INTRAVENOUS | Status: DC

## 2017-09-24 MED ORDER — PROPOFOL 10 MG/ML IV BOLUS
INTRAVENOUS | Status: DC | PRN
  Administered 2017-09-24: 100 mg via INTRAVENOUS

## 2017-09-24 MED ORDER — FENTANYL CITRATE (PF) 100 MCG/2ML IJ SOLN
INTRAMUSCULAR | Status: DC | PRN
  Administered 2017-09-24 (×2): 50 ug via INTRAVENOUS

## 2017-09-24 MED ORDER — FENTANYL CITRATE (PF) 100 MCG/2ML IJ SOLN
INTRAMUSCULAR | Status: AC
  Filled 2017-09-24: qty 2

## 2017-09-24 MED ORDER — PROPOFOL 500 MG/50ML IV EMUL
INTRAVENOUS | Status: DC | PRN
  Administered 2017-09-24: 140 ug/kg/min via INTRAVENOUS

## 2017-09-24 MED ORDER — SODIUM CHLORIDE 0.9 % IV SOLN
INTRAVENOUS | Status: DC
  Administered 2017-09-24 (×2): via INTRAVENOUS

## 2017-09-24 MED ORDER — LIDOCAINE 2% (20 MG/ML) 5 ML SYRINGE
INTRAMUSCULAR | Status: DC | PRN
  Administered 2017-09-24: 30 mg via INTRAVENOUS

## 2017-09-24 NOTE — H&P (Signed)
Primary Care Physician:  Marinda Elk, MD Primary Gastroenterologist:  Dr. Vira Agar  Pre-Procedure History & Physical: HPI:  Pamela Conley is a 77 y.o. female is here for an colonoscopy.  Done for family history of colon polyps.  Past Medical History:  Diagnosis Date  . Anemia   . Arthritis   . Cancer ([place])    skin  . 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 Chronic kidney disease   . GERD (gastroesophageal reflux disease)   . Hyperlipidemia   . Hypertension   . Hypothyroidism   . Peripheral vascular disease ([place])   . PONV (postoperative nausea and vomiting)   . Subdural hematoma ([place])   . Superficial thrombophlebitis   . Venous (peripheral) insufficiency   . Venous insufficiency     Past Surgical History:  Procedure Laterality Date  . APPENDECTOMY    . COLONOSCOPY    . DILATION AND CURETTAGE OF UTERUS    . edg    . HERNIA REPAIR    . INGUINAL HERNIA REPAIR Right 07/11/2017   Procedure: HERNIA REPAIR INGUINAL ADULT;  Surgeon: Leonie Green, MD;  Location: [facility] ORS;  Service: General;  Laterality: Right;  . KNEE ARTHROSCOPY Right   . skin cancer removed from face    . SUBDURAL HEMATOMA EVACUATION VIA CRANIOTOMY      Prior to Admission medications   Medication Sig Start Date End Date Taking? Authorizing Provider  aspirin EC 81 MG tablet Take 81 mg by mouth daily.   Yes [provider]  Calcium Carb-Cholecalciferol (CALCIUM-VITAMIN D) 500-200 MG-UNIT tablet Take 1 tablet by mouth 2 (two) times daily.   Yes [provider]  Cholecalciferol (VITAMIN D3) 1000 units CAPS Take 1,000 Units by mouth 2 (two) times daily.   Yes [provider]  levothyroxine (SYNTHROID, LEVOTHROID) 75 MCG tablet Take 75 mcg by mouth daily before breakfast.   Yes [provider]  Multiple Vitamins-Calcium (ONE-A-DAY WOMENS PO) Take 1 tablet by mouth daily.   Yes [provider]  omeprazole (PRILOSEC) 20 MG capsule Take 20  mg by mouth daily.   Yes [provider]  Polyethyl Glycol-Propyl Glycol (SYSTANE OP) Place 1 drop into both eyes daily.   Yes [provider]  Probiotic Product (PROBIOTIC DAILY PO) Take 1 capsule by mouth daily.   Yes [provider]  ramipril (ALTACE) 2.5 MG capsule Take 2.5 mg by mouth daily.   Yes [provider]  simethicone (MYLICON) 80 MG chewable tablet Chew 80 mg by mouth every 6 (six) hours as needed for flatulence.   Yes [provider]  triamterene-hydrochlorothiazide (MAXZIDE-25) 37.5-25 MG tablet Take 1 tablet by mouth daily.   Yes [provider]  acetaminophen (TYLENOL) 500 MG tablet Take 500 mg by mouth every 6 (six) hours as needed for moderate pain or headache.    [provider]  etodolac (LODINE) 400 MG tablet Take 400 mg by mouth daily.    [provider]  HYDROcodone-acetaminophen (NORCO) 5-325 MG tablet Take 1-2 tablets by mouth every 6 (six) hours as needed for moderate pain. 07/11/17   Leonie Green, MD  Polyethyl Glycol-Propyl Glycol (SYSTANE) 0.4-0.3 % GEL ophthalmic gel Place 1 application into both eyes at bedtime.    [provider]    Allergies as of 09/18/2017 - Review Complete 07/11/2017  Allergen Reaction Noted  . Esomeprazole magnesium Hives 11/09/2013    Family History  Problem Relation Age of Onset  .  Breast cancer Neg Hx     Social History   Socioeconomic History  . Marital status: Married    Spouse name: Not on file  . Number of children: Not on file  . Years of education: Not on file  . Highest education level: Not on file  Occupational History  . Not on file  Social Needs  . Financial resource strain: Not on file  . Food insecurity:    Worry: Not on file    Inability: Not on file  . Transportation needs:    Medical: Not on file    Non-medical: Not on file  Tobacco Use  . Smoking status: Never Smoker  . Smokeless tobacco: Never Used  Substance and  Sexual Activity  . Alcohol use: No    Frequency: Never  . Drug use: No  . Sexual activity: Not Currently  Lifestyle  . Physical activity:    Days per week: Not on file    Minutes per session: Not on file  . Stress: Not on file  Relationships  . Social connections:    Talks on phone: Not on file    Gets together: Not on file    Attends religious service: Not on file    Active member of club or organization: Not on file    Attends meetings of clubs or organizations: Not on file    Relationship status: Not on file  . Intimate partner violence:    Fear of current or ex partner: Not on file    Emotionally abused: Not on file    Physically abused: Not on file    Forced sexual activity: Not on file  Other Topics Concern  . Not on file  Social History Narrative  . Not on file    Review of Systems: See HPI, otherwise negative ROS  Physical Exam: BP (!) 156/90   Pulse 80   Temp (!) 97.2 F (36.2 C) (Tympanic)   Resp 16   Ht 5\' 4"  (1.626 m)   Wt 72.1 kg (159 lb)   SpO2 100%   BMI 27.29 kg/m  General:   Alert,  pleasant and cooperative in NAD Head:  Normocephalic and atraumatic. Neck:  Supple; no masses or thyromegaly. Lungs:  Clear throughout to auscultation.    Heart:  Regular rate and rhythm. Abdomen:  Soft, nontender and nondistended. Normal bowel sounds, without guarding, and without rebound.   Neurologic:  Alert and  oriented x4;  grossly normal neurologically.  Impression/Plan: Pamela Conley is here for an colonoscopy to be performed for FH colon polyps  Risks, benefits, limitations, and alternatives regarding  colonoscopy have been reviewed with the patient.  Questions have been answered.  All parties agreeable.   Gaylyn Cheers, MD  09/24/2017, 11:57 AM

## 2017-09-24 NOTE — Transfer of Care (Signed)
Immediate Anesthesia Transfer of Care Note  Patient: Pamela Conley  Procedure(s) Performed: COLONOSCOPY WITH PROPOFOL (N/A )  Patient Location: PACU and Endoscopy Unit  Anesthesia Type:General  Level of Consciousness: awake, drowsy and patient cooperative  Airway & Oxygen Therapy: Patient Spontanous Breathing and Patient connected to nasal cannula oxygen  Post-op Assessment: Report given to RN and Post -op Vital signs reviewed and stable  Post vital signs: Reviewed and stable  Last Vitals:  Vitals Value Taken Time  BP    Temp    Pulse 76 09/24/2017 12:27 PM  Resp 16 09/24/2017 12:27 PM  SpO2 98 % 09/24/2017 12:27 PM  Vitals shown include unvalidated device data.  Last Pain:  Vitals:   09/24/17 1034  TempSrc: Tympanic         Complications: No apparent anesthesia complications

## 2017-09-24 NOTE — Anesthesia Preprocedure Evaluation (Signed)
Anesthesia Evaluation  Patient identified by MRN, date of birth, ID band Patient awake    Reviewed: Allergy & Precautions, H&P , NPO status , Patient's Chart, lab work & pertinent test results, reviewed documented beta blocker date and time   History of Anesthesia Complications (+) PONV and history of anesthetic complications  Airway Mallampati: II   Neck ROM: full    Dental  (+) Poor Dentition   Pulmonary neg pulmonary ROS,    Pulmonary exam normal        Cardiovascular hypertension, On Medications + Peripheral Vascular Disease  negative cardio ROS Normal cardiovascular exam Rhythm:regular Rate:Normal     Neuro/Psych negative neurological ROS  negative psych ROS   GI/Hepatic negative GI ROS, Neg liver ROS, GERD  ,  Endo/Other  negative endocrine ROSHypothyroidism   Renal/GU Renal diseasenegative Renal ROS  negative genitourinary   Musculoskeletal   Abdominal   Peds  Hematology negative hematology ROS (+) anemia ,   Anesthesia Other Findings Past Medical History: No date: Anemia No date: Arthritis No date: Cancer (HCC)     Comment:  skin No date: 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 No date: Chronic kidney disease No date: GERD (gastroesophageal reflux disease) No date: Hyperlipidemia No date: Hypertension No date: Hypothyroidism No date: Peripheral vascular disease (HCC) No date: PONV (postoperative nausea and vomiting) No date: Subdural hematoma (HCC) No date: Superficial thrombophlebitis No date: Venous (peripheral) insufficiency No date: Venous insufficiency Past Surgical History: No date: APPENDECTOMY No date: COLONOSCOPY No date: DILATION AND CURETTAGE OF UTERUS No date: edg No date: HERNIA REPAIR 07/11/2017: INGUINAL HERNIA REPAIR; Right     Comment:  Procedure: HERNIA REPAIR INGUINAL ADULT;  Surgeon:               Leonie Green, MD;  Location: [facility] ORS;  Service:         General;  Laterality: Right; No date: KNEE ARTHROSCOPY; Right No date: skin cancer removed from face No date: SUBDURAL HEMATOMA EVACUATION VIA CRANIOTOMY BMI    Body Mass Index:  27.29 kg/m     Reproductive/Obstetrics negative OB ROS                             Anesthesia Physical Anesthesia Plan  ASA: III  Anesthesia Plan: General   Post-op Pain Management:    Induction:   PONV Risk Score and Plan:   Airway Management Planned:   Additional Equipment:   Intra-op Plan:   Post-operative Plan:   Informed Consent: I have reviewed the patients History and Physical, chart, labs and discussed the procedure including the risks, benefits and alternatives for the proposed anesthesia with the patient or authorized representative who has indicated his/her understanding and acceptance.   Dental Advisory Given  Plan Discussed with: CRNA  Anesthesia Plan Comments:         Anesthesia Quick Evaluation

## 2017-09-24 NOTE — Anesthesia Post-op Follow-up Note (Signed)
Anesthesia QCDR form completed.        

## 2017-09-24 NOTE — Anesthesia Postprocedure Evaluation (Signed)
Anesthesia Post Note  Patient: Pamela Conley  Procedure(s) Performed: COLONOSCOPY WITH PROPOFOL (N/A )  Patient location during evaluation: PACU Anesthesia Type: General Level of consciousness: awake and alert Pain management: pain level controlled Vital Signs Assessment: post-procedure vital signs reviewed and stable Respiratory status: spontaneous breathing, nonlabored ventilation, respiratory function stable and patient connected to nasal cannula oxygen Cardiovascular status: blood pressure returned to baseline and stable Postop Assessment: no apparent nausea or vomiting Anesthetic complications: no     Last Vitals:  Vitals:   09/24/17 1238 09/24/17 1248  BP: 130/78 140/77  Pulse: 74 80  Resp: 14 17  Temp:    SpO2: 99% 100%    Last Pain:  Vitals:   09/24/17 1248  TempSrc:   PainSc: 0-No pain                 Molli Barrows

## 2017-09-24 NOTE — Op Note (Signed)
[facility] Patient Name: Pamela Conley Procedure Date: 09/24/2017 11:59 AM MRN: [id number] Account #: [id number] Date of Birth: Sep 18, 1940 Admit Type: Outpatient Age: 77 Room: [facility] ENDO ROOM 3 Gender: Female Note Status: Finalized Procedure:            Colonoscopy Indications:          Family history of colonic polyps in a first-degree                        relative Providers:            Manya Silvas, MD Referring MD:         Precious Bard, MD (Referring MD) Medicines:            Propofol per Anesthesia Complications:        No immediate complications. Procedure:            Pre-Anesthesia Assessment:                       - After reviewing the risks and benefits, the patient                        was deemed in satisfactory condition to undergo the                        procedure.                       After obtaining informed consent, the colonoscope was                        passed under direct vision. Throughout the procedure,                        the patient's blood pressure, pulse, and oxygen                        saturations were monitored continuously. The                        Colonoscope was introduced through the anus and                        advanced to the the cecum, identified by appendiceal                        orifice and ileocecal valve. The colonoscopy was                        performed without difficulty. The patient tolerated the                        procedure well. The quality of the bowel preparation                        was good. Findings:      Two sessile polyps were found in the cecum. The polyps were diminutive       in size. These polyps were removed with a jumbo cold forceps. Resection       and retrieval were complete.      Many small-mouthed diverticula were  found in the sigmoid colon and       descending colon.      Internal hemorrhoids were found during endoscopy. The hemorrhoids  were       medium-sized and Grade I (internal hemorrhoids that do not prolapse).      The exam was otherwise without abnormality. Impression:           - Two diminutive polyps in the cecum, removed with a                        jumbo cold forceps. Resected and retrieved.                       - Diverticulosis in the sigmoid colon and in the                        descending colon.                       - Internal hemorrhoids.                       - The examination was otherwise normal. Recommendation:       - Await pathology results. Manya Silvas, MD 09/24/2017 12:26:51 PM This report has been signed electronically. Number of Addenda: 0 Note Initiated On: 09/24/2017 11:59 AM Scope Withdrawal Time: 0 hours 11 minutes 11 seconds  Total Procedure Duration: 0 hours 16 minutes 41 seconds       [facility]

## 2017-09-25 ENCOUNTER — Encounter: Payer: Self-pay | Admitting: Unknown Physician Specialty

## 2017-09-25 LAB — SURGICAL PATHOLOGY

## 2017-10-08 ENCOUNTER — Inpatient Hospital Stay: Admission: RE | Admit: 2017-10-08 | Payer: Medicare Other | Source: Ambulatory Visit

## 2017-11-26 ENCOUNTER — Encounter
Admission: RE | Admit: 2017-11-26 | Discharge: 2017-11-26 | Disposition: A | Discharge status: Home / Self Care | Payer: Medicare Other | Source: Ambulatory Visit | Attending: Orthopedic Surgery | Admitting: Orthopedic Surgery

## 2017-11-26 ENCOUNTER — Other Ambulatory Visit: Payer: Self-pay

## 2017-11-26 DIAGNOSIS — Z01812 Encounter for preprocedural laboratory examination: Secondary | ICD-10-CM | POA: Insufficient documentation

## 2017-11-26 HISTORY — DX: Headache: R51

## 2017-11-26 HISTORY — DX: Headache, unspecified: R51.9

## 2017-11-26 HISTORY — DX: Cardiac arrhythmia, unspecified: I49.9

## 2017-11-26 LAB — TYPE AND SCREEN
ABO/RH(D): O POS
Antibody Screen: NEGATIVE

## 2017-11-26 LAB — PROTIME-INR
INR: 0.98
Prothrombin Time: 12.9 seconds (ref 11.4–15.2)

## 2017-11-26 LAB — URINALYSIS, ROUTINE W REFLEX MICROSCOPIC
Bilirubin Urine: NEGATIVE
GLUCOSE, UA: NEGATIVE mg/dL
Hgb urine dipstick: NEGATIVE
KETONES UR: NEGATIVE mg/dL
LEUKOCYTES UA: NEGATIVE
NITRITE: NEGATIVE
PH: 6 (ref 5.0–8.0)
Protein, ur: NEGATIVE mg/dL
SPECIFIC GRAVITY, URINE: 1.006 (ref 1.005–1.030)

## 2017-11-26 LAB — C-REACTIVE PROTEIN

## 2017-11-26 LAB — SEDIMENTATION RATE: Sed Rate: 9 mm/hr (ref 0–30)

## 2017-11-26 LAB — APTT: APTT: 35 s (ref 24–36)

## 2017-11-26 LAB — SURGICAL PCR SCREEN
MRSA, PCR: NEGATIVE
STAPHYLOCOCCUS AUREUS: NEGATIVE

## 2017-11-26 NOTE — Patient Instructions (Signed)
Your procedure is scheduled on: Wednesday, December 10, 2017  Report to [facility]  To find out your arrival time please call [phone] between 1PM - 3PM on Tuesday, December 09, 2017  Remember: Instructions that are not followed completely may result in serious medical risk,  up to and including death, or upon the discretion of your surgeon and anesthesiologist your  surgery may need to be rescheduled.     _X__ 1. Do not eat food after midnight the night before your procedure.                 No gum chewing or hard candies. NOTHING SOLID IN YOUR MOUTH AFTER MIDNIGHT!!                 You may drink clear liquids up to 2 hours before you are scheduled to arrive for your surgery-                  DO not drink clear liquids within 2 hours of the start of your surgery.                  Clear Liquids include:  water, apple juice without pulp, clear carbohydrate                 drink such as Clearfast of Gatorade, Black Coffee or Tea (Do not add                 anything to coffee or tea).   __X__2.  On the morning of surgery brush your teeth with toothpaste and water, you                may rinse your mouth with mouthwash if you wish.                     Do not swallow any toothpaste of mouthwash.     _X__ 3.  No Alcohol for 24 hours before or after surgery.   _X__ 4.  Do Not Smoke or use e-cigarettes For 24 Hours Prior to Your Surgery.                 Do not use any chewable tobacco products for at least 6 hours prior to                 surgery.  ____  5.  Bring all medications with you on the day of surgery if instructed.   _X___  6.  Notify your doctor if there is any change in your medical condition      (cold, fever, infections).     Do not wear jewelry, make-up, hairpins, clips or nail polish. Do not wear lotions, powders, or perfumes. You may wear deodorant. Do not shave 48 hours prior to surgery. Men may shave face and neck. Do  not bring valuables to the hospital.    [facility] is not responsible for any belongings or valuables.  Contacts, dentures or bridgework may not be worn into surgery. Leave your suitcase in the car. After surgery it may be brought to your room. For patients admitted to the hospital, discharge time is determined by your treatment team.   Patients discharged the day of surgery will not be allowed to drive home.   Please read over the following fact sheets that you were given:   PREPARING FOR SURGERY    _X___ Take these medicines the morning of  surgery with A SIP OF WATER:    1.SYNTHROID  2. PRILOSEC  3.   4.  5.  6.  ____ Fleet Enema (as directed)   _X___ Use CHG Soap as directed  _X__ Stop ASPIRIN ONE WEEK BEFORE SURGERY ON THE 17TH OF July             THIS INCLUDES EXCEDRIN / BC POWDERS / GOODIES POWDERS  _X___ Stop Anti-inflammatories AS OF THE 17TH OF July             THIS INCLUDES IBUPROFEN / MOTRIN / ADVIL / ALEVE / LODINE   _X___ Stop supplements until after surgery.                THIS INCLUDES CALCIUM +D / PROBIOTIC / MYLICON  ____ Bring C-Pap to the hospital.   CONTINUE TAKING MAXZIDE / ALTACE (RAMIPRIL) / VITAMIN D / CITRICEL / COLACE / RESTORE DROPS / SYSTANE DROPS       BUT DO NOT TAKE ANY OF THESE ON THE MORNING OF SURGERY  YOU MAY ALSO CONTINUE TAKING TYLENOL AND TYLENOL PM AT ANY TIME PRIOR TO SURGERY  BRING SAFE AND STABLE SHOES TO [facility].  MAKE SURE TO HAVE STOOL SOFTENERS ON HAND FOR WHEN YOU GET HOME  PLEASE BRING A COPY OF YOUR ADVANCE DIRECTIVES SO WE MAY MAKE A COPY FOR YOUR CHART.

## 2017-11-27 LAB — URINE CULTURE: CULTURE: NO GROWTH

## 2017-11-30 NOTE — Discharge Instructions (Signed)
°  Instructions after Total Knee Replacement ° ° Rhe[facility] [place] P. Rohin Krejci, Jr., M.D.    ° Dept. of Orthopaedics & Sports Medicine ° [facility] ° [address] ° [place], [place]  [address] ° Phone: [phone]   Fax: [phone] ° °  °DIET: °• Drink plenty of non-alcoholic fluids. °• Resume your normal diet. Include foods high in fiber. ° °ACTIVITY:  °• You may use crutches or a walker with weight-bearing as tolerated, unless instructed otherwise. °• You may be weaned off of the walker or crutches by your Physical Therapist.  °• Do NOT place pillows under the knee. Anything placed under the knee could limit your ability to straighten the knee.   °• Continue doing gentle exercises. Exercising will reduce the pain and swelling, increase motion, and prevent muscle weakness.   °• Please continue to use the TED compression stockings for 6 weeks. You may remove the stockings at night, but should reapply them in the morning. °• Do not drive or operate any equipment until instructed. ° °WOUND CARE:  °• Continue to use the PolarCare or ice packs periodically to reduce pain and swelling. °• You may bathe or shower after the staples are removed at the first office visit following surgery. ° °MEDICATIONS: °• You may resume your regular medications. °• Please take the pain medication as prescribed on the medication. °• Do not take pain medication on an empty stomach. °• You have been given a prescription for a blood thinner (Lovenox or Coumadin). Please take the medication as instructed. (NOTE: After completing a 2 week course of Lovenox, take one Enteric-coated aspirin once a day. This along with elevation will help reduce the possibility of phlebitis in your operated leg.) °• Do not drive or drink alcoholic beverages when taking pain medications. ° °CALL THE OFFICE FOR: °• Temperature above 101 degrees °• Excessive bleeding or drainage on the dressing. °• Excessive swelling, coldness, or paleness of the toes. °• Persistent  nausea and vomiting. ° °FOLLOW-UP:  °• You should have an appointment to return to the office in 10-14 days after surgery. °• Arrangements have been made for continuation of Physical Therapy (either home therapy or outpatient therapy). °  °

## 2017-12-09 MED ORDER — CEFAZOLIN SODIUM-DEXTROSE 2-4 GM/100ML-% IV SOLN: 2.0000 g | INTRAVENOUS | Status: DC

## 2017-12-09 MED ORDER — TRANEXAMIC ACID 1000 MG/10ML IV SOLN
1000.0000 mg | INTRAVENOUS | Status: DC
  Filled 2017-12-09: qty 10

## 2017-12-10 ENCOUNTER — Encounter: Payer: Self-pay | Admitting: Orthopedic Surgery

## 2017-12-10 ENCOUNTER — Encounter
Admission: RE | Disposition: A | Discharge status: Home Health | Payer: Self-pay | Source: Ambulatory Visit | Attending: Orthopedic Surgery

## 2017-12-10 ENCOUNTER — Inpatient Hospital Stay: Payer: Medicare Other

## 2017-12-10 ENCOUNTER — Ambulatory Visit: Payer: Medicare Other | Admitting: Anesthesiology

## 2017-12-10 ENCOUNTER — Inpatient Hospital Stay
Admission: RE | Admit: 2017-12-10 | Discharge: 2017-12-12 | DRG: 470 | Disposition: A | Discharge status: Home Health | Payer: Medicare Other | Source: Ambulatory Visit | Attending: Orthopedic Surgery | Admitting: Orthopedic Surgery

## 2017-12-10 ENCOUNTER — Other Ambulatory Visit: Payer: Self-pay

## 2017-12-10 DIAGNOSIS — K219 Gastro-esophageal reflux disease without esophagitis: Secondary | ICD-10-CM | POA: Diagnosis present

## 2017-12-10 DIAGNOSIS — I129 Hypertensive chronic kidney disease with stage 1 through stage 4 chronic kidney disease, or unspecified chronic kidney disease: Secondary | ICD-10-CM | POA: Diagnosis present

## 2017-12-10 DIAGNOSIS — I872 Venous insufficiency (chronic) (peripheral): Secondary | ICD-10-CM | POA: Diagnosis present

## 2017-12-10 DIAGNOSIS — E785 Hyperlipidemia, unspecified: Secondary | ICD-10-CM | POA: Diagnosis present

## 2017-12-10 DIAGNOSIS — N189 Chronic kidney disease, unspecified: Secondary | ICD-10-CM | POA: Diagnosis present

## 2017-12-10 DIAGNOSIS — I739 Peripheral vascular disease, unspecified: Secondary | ICD-10-CM | POA: Diagnosis present

## 2017-12-10 DIAGNOSIS — E039 Hypothyroidism, unspecified: Secondary | ICD-10-CM | POA: Diagnosis present

## 2017-12-10 DIAGNOSIS — Z96651 Presence of right artificial knee joint: Secondary | ICD-10-CM

## 2017-12-10 DIAGNOSIS — Z96659 Presence of unspecified artificial knee joint: Secondary | ICD-10-CM

## 2017-12-10 DIAGNOSIS — M1711 Unilateral primary osteoarthritis, right knee: Principal | ICD-10-CM | POA: Diagnosis present

## 2017-12-10 HISTORY — PX: KNEE ARTHROPLASTY: SHX992

## 2017-12-10 LAB — CBC
HCT: 38.5 % (ref 35.0–47.0)
Hemoglobin: 13.2 g/dL (ref 12.0–16.0)
MCH: 31 pg (ref 26.0–34.0)
MCHC: 34.2 g/dL (ref 32.0–36.0)
MCV: 90.6 fL (ref 80.0–100.0)
PLATELETS: 195 10*3/uL (ref 150–440)
RBC: 4.25 MIL/uL (ref 3.80–5.20)
RDW: 14.6 % — AB (ref 11.5–14.5)
WBC: 6.3 10*3/uL (ref 3.6–11.0)

## 2017-12-10 LAB — ABO/RH: ABO/RH(D): O POS

## 2017-12-10 SURGERY — ARTHROPLASTY, KNEE, TOTAL, USING IMAGELESS COMPUTER-ASSISTED NAVIGATION
Anesthesia: Spinal | Laterality: Right

## 2017-12-10 MED ORDER — RAMIPRIL 2.5 MG PO CAPS
2.5000 mg | ORAL_CAPSULE | Freq: Every day | ORAL | Status: DC
  Administered 2017-12-10 – 2017-12-12 (×3): 2.5 mg via ORAL
  Filled 2017-12-10 (×3): qty 1

## 2017-12-10 MED ORDER — DEXAMETHASONE SODIUM PHOSPHATE 10 MG/ML IJ SOLN
8.0000 mg | Freq: Once | INTRAMUSCULAR | Status: AC
  Administered 2017-12-10: 8 mg via INTRAVENOUS

## 2017-12-10 MED ORDER — PROPOFOL 500 MG/50ML IV EMUL
INTRAVENOUS | Status: DC | PRN
  Administered 2017-12-10: 50 ug/kg/min via INTRAVENOUS

## 2017-12-10 MED ORDER — SODIUM CHLORIDE 0.9 % IV SOLN
INTRAVENOUS | Status: DC
  Administered 2017-12-10 – 2017-12-11 (×3): via INTRAVENOUS

## 2017-12-10 MED ORDER — ENOXAPARIN SODIUM 30 MG/0.3ML ~~LOC~~ SOLN
30.0000 mg | Freq: Two times a day (BID) | SUBCUTANEOUS | Status: DC
  Administered 2017-12-11 – 2017-12-12 (×3): 30 mg via SUBCUTANEOUS
  Filled 2017-12-10 (×3): qty 0.3

## 2017-12-10 MED ORDER — BUPIVACAINE LIPOSOME 1.3 % IJ SUSP
INTRAMUSCULAR | Status: AC
  Filled 2017-12-10: qty 20

## 2017-12-10 MED ORDER — CEFAZOLIN SODIUM-DEXTROSE 2-3 GM-%(50ML) IV SOLR
INTRAVENOUS | Status: DC | PRN
  Administered 2017-12-10: 2 g via INTRAVENOUS

## 2017-12-10 MED ORDER — MIDAZOLAM HCL 2 MG/2ML IJ SOLN
INTRAMUSCULAR | Status: AC
  Filled 2017-12-10: qty 2

## 2017-12-10 MED ORDER — BUPIVACAINE HCL (PF) 0.5 % IJ SOLN
INTRAMUSCULAR | Status: DC | PRN
  Administered 2017-12-10: 3 mL

## 2017-12-10 MED ORDER — ACETAMINOPHEN 325 MG PO TABS: 325.0000 mg | ORAL_TABLET | Freq: Four times a day (QID) | ORAL | Status: DC | PRN

## 2017-12-10 MED ORDER — ACETAMINOPHEN 10 MG/ML IV SOLN
1000.0000 mg | Freq: Four times a day (QID) | INTRAVENOUS | Status: AC
  Administered 2017-12-10 – 2017-12-11 (×4): 1000 mg via INTRAVENOUS
  Filled 2017-12-10 (×4): qty 100

## 2017-12-10 MED ORDER — METHYLCELLULOSE (LAXATIVE) 500 MG PO TABS: 1000.0000 mg | ORAL_TABLET | Freq: Every day | ORAL | Status: DC

## 2017-12-10 MED ORDER — CELECOXIB 200 MG PO CAPS
ORAL_CAPSULE | ORAL | Status: AC
  Administered 2017-12-10: 400 mg
  Filled 2017-12-10: qty 2

## 2017-12-10 MED ORDER — BUPIVACAINE HCL (PF) 0.25 % IJ SOLN
INTRAMUSCULAR | Status: AC
  Filled 2017-12-10: qty 60

## 2017-12-10 MED ORDER — LEVOTHYROXINE SODIUM 50 MCG PO TABS
75.0000 ug | ORAL_TABLET | Freq: Every day | ORAL | Status: DC
  Administered 2017-12-11 – 2017-12-12 (×2): 75 ug via ORAL
  Filled 2017-12-10 (×2): qty 1

## 2017-12-10 MED ORDER — ONDANSETRON HCL 4 MG/2ML IJ SOLN: 4.0000 mg | Freq: Four times a day (QID) | INTRAMUSCULAR | Status: DC | PRN

## 2017-12-10 MED ORDER — PANTOPRAZOLE SODIUM 40 MG PO TBEC
40.0000 mg | DELAYED_RELEASE_TABLET | Freq: Two times a day (BID) | ORAL | Status: DC
  Administered 2017-12-10 – 2017-12-12 (×4): 40 mg via ORAL
  Filled 2017-12-10 (×4): qty 1

## 2017-12-10 MED ORDER — PROPOFOL 500 MG/50ML IV EMUL
INTRAVENOUS | Status: AC
  Filled 2017-12-10: qty 50

## 2017-12-10 MED ORDER — SODIUM CHLORIDE FLUSH 0.9 % IV SOLN
INTRAVENOUS | Status: AC
  Filled 2017-12-10: qty 40

## 2017-12-10 MED ORDER — RISAQUAD PO CAPS
1.0000 | ORAL_CAPSULE | Freq: Every day | ORAL | Status: DC
  Administered 2017-12-10 – 2017-12-12 (×3): 1 via ORAL
  Filled 2017-12-10 (×3): qty 1

## 2017-12-10 MED ORDER — ACETAMINOPHEN 10 MG/ML IV SOLN
INTRAVENOUS | Status: AC
  Filled 2017-12-10: qty 100

## 2017-12-10 MED ORDER — DEXAMETHASONE SODIUM PHOSPHATE 10 MG/ML IJ SOLN
INTRAMUSCULAR | Status: AC
  Administered 2017-12-10: 8 mg via INTRAVENOUS
  Filled 2017-12-10: qty 1

## 2017-12-10 MED ORDER — BISACODYL 10 MG RE SUPP: 10.0000 mg | Freq: Every day | RECTAL | Status: DC | PRN

## 2017-12-10 MED ORDER — CELECOXIB 200 MG PO CAPS
200.0000 mg | ORAL_CAPSULE | Freq: Two times a day (BID) | ORAL | Status: DC
  Administered 2017-12-10 – 2017-12-12 (×4): 200 mg via ORAL
  Filled 2017-12-10 (×4): qty 1

## 2017-12-10 MED ORDER — OXYCODONE HCL 5 MG PO TABS
10.0000 mg | ORAL_TABLET | ORAL | Status: DC | PRN
  Administered 2017-12-11 – 2017-12-12 (×4): 10 mg via ORAL
  Filled 2017-12-10 (×4): qty 2

## 2017-12-10 MED ORDER — MAGNESIUM HYDROXIDE 400 MG/5ML PO SUSP
30.0000 mL | Freq: Every day | ORAL | Status: DC
  Administered 2017-12-10 – 2017-12-12 (×3): 30 mL via ORAL
  Filled 2017-12-10 (×3): qty 30

## 2017-12-10 MED ORDER — METOCLOPRAMIDE HCL 10 MG PO TABS
10.0000 mg | ORAL_TABLET | Freq: Three times a day (TID) | ORAL | Status: DC
  Administered 2017-12-10 – 2017-12-12 (×7): 10 mg via ORAL
  Filled 2017-12-10 (×7): qty 1

## 2017-12-10 MED ORDER — OXYCODONE HCL 5 MG/5ML PO SOLN: 5.0000 mg | Freq: Once | ORAL | Status: DC | PRN

## 2017-12-10 MED ORDER — LACTATED RINGERS IV SOLN
INTRAVENOUS | Status: DC | PRN
  Administered 2017-12-10 (×2): via INTRAVENOUS

## 2017-12-10 MED ORDER — CEFAZOLIN SODIUM-DEXTROSE 2-4 GM/100ML-% IV SOLN
INTRAVENOUS | Status: AC
  Filled 2017-12-10: qty 100

## 2017-12-10 MED ORDER — ACETAMINOPHEN 10 MG/ML IV SOLN
INTRAVENOUS | Status: DC | PRN
  Administered 2017-12-10: 1000 mg via INTRAVENOUS

## 2017-12-10 MED ORDER — BUPIVACAINE HCL (PF) 0.25 % IJ SOLN
INTRAMUSCULAR | Status: DC | PRN
  Administered 2017-12-10: 60 mL

## 2017-12-10 MED ORDER — FERROUS SULFATE 325 (65 FE) MG PO TABS
325.0000 mg | ORAL_TABLET | Freq: Two times a day (BID) | ORAL | Status: DC
  Administered 2017-12-10 – 2017-12-12 (×4): 325 mg via ORAL
  Filled 2017-12-10 (×4): qty 1

## 2017-12-10 MED ORDER — VITAMIN D 1000 UNITS PO TABS
1000.0000 [IU] | ORAL_TABLET | Freq: Every day | ORAL | Status: DC
  Administered 2017-12-10 – 2017-12-12 (×3): 1000 [IU] via ORAL
  Filled 2017-12-10 (×4): qty 1

## 2017-12-10 MED ORDER — HYDROMORPHONE HCL 1 MG/ML IJ SOLN: 0.5000 mg | INTRAMUSCULAR | Status: DC | PRN

## 2017-12-10 MED ORDER — ONDANSETRON HCL 4 MG PO TABS: 4.0000 mg | ORAL_TABLET | Freq: Four times a day (QID) | ORAL | Status: DC | PRN

## 2017-12-10 MED ORDER — GLYCOPYRROLATE 0.2 MG/ML IJ SOLN
INTRAMUSCULAR | Status: DC | PRN
  Administered 2017-12-10: 0.2 mg via INTRAVENOUS

## 2017-12-10 MED ORDER — PROPOFOL 10 MG/ML IV BOLUS
INTRAVENOUS | Status: DC | PRN
  Administered 2017-12-10: 20 mg via INTRAVENOUS
  Administered 2017-12-10: 40 mg via INTRAVENOUS

## 2017-12-10 MED ORDER — PHENOL 1.4 % MT LIQD
1.0000 | OROMUCOSAL | Status: DC | PRN
  Filled 2017-12-10: qty 177

## 2017-12-10 MED ORDER — CEFAZOLIN SODIUM-DEXTROSE 2-4 GM/100ML-% IV SOLN
2.0000 g | Freq: Four times a day (QID) | INTRAVENOUS | Status: AC
  Administered 2017-12-10 – 2017-12-11 (×4): 2 g via INTRAVENOUS
  Filled 2017-12-10 (×4): qty 100

## 2017-12-10 MED ORDER — MENTHOL 3 MG MT LOZG
1.0000 | LOZENGE | OROMUCOSAL | Status: DC | PRN
  Filled 2017-12-10: qty 9

## 2017-12-10 MED ORDER — CALCIUM CARBONATE-VITAMIN D 500-200 MG-UNIT PO TABS
1.0000 | ORAL_TABLET | Freq: Two times a day (BID) | ORAL | Status: DC
  Administered 2017-12-10 – 2017-12-12 (×4): 1 via ORAL
  Filled 2017-12-10 (×4): qty 1

## 2017-12-10 MED ORDER — POLYETHYL GLYCOL-PROPYL GLYCOL 0.4-0.3 % OP GEL: 1.0000 "application " | Freq: Every day | OPHTHALMIC | Status: DC

## 2017-12-10 MED ORDER — SCOPOLAMINE 1 MG/3DAYS TD PT72
MEDICATED_PATCH | TRANSDERMAL | Status: AC
  Administered 2017-12-10: 1.5 mg
  Filled 2017-12-10: qty 1

## 2017-12-10 MED ORDER — METOCLOPRAMIDE HCL 10 MG PO TABS: 5.0000 mg | ORAL_TABLET | Freq: Three times a day (TID) | ORAL | Status: DC | PRN

## 2017-12-10 MED ORDER — TRANEXAMIC ACID 1000 MG/10ML IV SOLN
1000.0000 mg | Freq: Once | INTRAVENOUS | Status: AC
  Administered 2017-12-10: 1000 mg via INTRAVENOUS
  Filled 2017-12-10: qty 10

## 2017-12-10 MED ORDER — SENNOSIDES-DOCUSATE SODIUM 8.6-50 MG PO TABS
1.0000 | ORAL_TABLET | Freq: Two times a day (BID) | ORAL | Status: DC
  Administered 2017-12-10 – 2017-12-12 (×5): 1 via ORAL
  Filled 2017-12-10 (×5): qty 1

## 2017-12-10 MED ORDER — GABAPENTIN 300 MG PO CAPS
300.0000 mg | ORAL_CAPSULE | Freq: Every day | ORAL | Status: DC
  Administered 2017-12-10 – 2017-12-11 (×2): 300 mg via ORAL
  Filled 2017-12-10 (×2): qty 1

## 2017-12-10 MED ORDER — METOCLOPRAMIDE HCL 5 MG/ML IJ SOLN: 5.0000 mg | Freq: Three times a day (TID) | INTRAMUSCULAR | Status: DC | PRN

## 2017-12-10 MED ORDER — ALUM & MAG HYDROXIDE-SIMETH 200-200-20 MG/5ML PO SUSP: 30.0000 mL | ORAL | Status: DC | PRN

## 2017-12-10 MED ORDER — GABAPENTIN 300 MG PO CAPS
ORAL_CAPSULE | ORAL | Status: AC
  Administered 2017-12-10: 300 mg via ORAL
  Filled 2017-12-10: qty 1

## 2017-12-10 MED ORDER — CHLORHEXIDINE GLUCONATE 4 % EX LIQD: 60.0000 mL | Freq: Once | CUTANEOUS | Status: DC

## 2017-12-10 MED ORDER — TRANEXAMIC ACID 1000 MG/10ML IV SOLN
INTRAVENOUS | Status: DC | PRN
  Administered 2017-12-10: 1000 mg via INTRAVENOUS

## 2017-12-10 MED ORDER — FENTANYL CITRATE (PF) 100 MCG/2ML IJ SOLN: 25.0000 ug | INTRAMUSCULAR | Status: DC | PRN

## 2017-12-10 MED ORDER — TRIAMTERENE-HCTZ 37.5-25 MG PO TABS
0.5000 | ORAL_TABLET | Freq: Every day | ORAL | Status: DC
  Administered 2017-12-10: 1 via ORAL
  Administered 2017-12-11: 0.5 via ORAL
  Administered 2017-12-12: 1 via ORAL
  Filled 2017-12-10 (×3): qty 1

## 2017-12-10 MED ORDER — SIMETHICONE 80 MG PO CHEW
80.0000 mg | CHEWABLE_TABLET | Freq: Four times a day (QID) | ORAL | Status: DC | PRN
Start: 2017-12-10 — End: 2017-12-12
  Filled 2017-12-10: qty 1

## 2017-12-10 MED ORDER — OXYCODONE HCL 5 MG PO TABS
5.0000 mg | ORAL_TABLET | ORAL | Status: DC | PRN
  Administered 2017-12-10 – 2017-12-11 (×3): 5 mg via ORAL
  Filled 2017-12-10 (×3): qty 1

## 2017-12-10 MED ORDER — SODIUM CHLORIDE 0.9 % IV SOLN
INTRAVENOUS | Status: DC | PRN
  Administered 2017-12-10: 60 mL

## 2017-12-10 MED ORDER — GABAPENTIN 300 MG PO CAPS
300.0000 mg | ORAL_CAPSULE | Freq: Once | ORAL | Status: AC
  Administered 2017-12-10: 300 mg via ORAL

## 2017-12-10 MED ORDER — DIPHENHYDRAMINE HCL 12.5 MG/5ML PO ELIX: 12.5000 mg | ORAL_SOLUTION | ORAL | Status: DC | PRN

## 2017-12-10 MED ORDER — TRAMADOL HCL 50 MG PO TABS
50.0000 mg | ORAL_TABLET | ORAL | Status: DC | PRN
  Administered 2017-12-10 – 2017-12-11 (×3): 50 mg via ORAL
  Administered 2017-12-11: 100 mg via ORAL
  Filled 2017-12-10: qty 2
  Filled 2017-12-10 (×3): qty 1

## 2017-12-10 MED ORDER — OXYCODONE HCL 5 MG PO TABS: 5.0000 mg | ORAL_TABLET | Freq: Once | ORAL | Status: DC | PRN

## 2017-12-10 MED ORDER — MIDAZOLAM HCL 5 MG/5ML IJ SOLN
INTRAMUSCULAR | Status: DC | PRN
  Administered 2017-12-10: 2 mg via INTRAVENOUS

## 2017-12-10 MED ORDER — NEOMYCIN-POLYMYXIN B GU 40-200000 IR SOLN
Status: DC | PRN
  Administered 2017-12-10: 14 mL

## 2017-12-10 MED ORDER — FLEET ENEMA 7-19 GM/118ML RE ENEM: 1.0000 | ENEMA | Freq: Once | RECTAL | Status: DC | PRN

## 2017-12-10 SURGICAL SUPPLY — 66 items
BATTERY INSTRU NAVIGATION (MISCELLANEOUS) ×12 IMPLANT
BLADE SAGITTAL 25.0X1.19X90 (BLADE) ×2 IMPLANT
BLADE SAGITTAL 25.0X1.19X90MM (BLADE) ×1
BLADE SAW 1/2 (BLADE) ×3 IMPLANT
BLADE SAW 70X12.5 (BLADE) ×3 IMPLANT
BLADE SAW 90X13X1.19 OSCILLAT (BLADE) ×3 IMPLANT
CANISTER SUCT 1200ML W/VALVE (MISCELLANEOUS) ×3 IMPLANT
CANISTER SUCT 3000ML PPV (MISCELLANEOUS) ×6 IMPLANT
CAPT KNEE TOTAL 3 ATTUNE ×3 IMPLANT
CEMENT HV SMART SET (Cement) ×6 IMPLANT
COOLER POLAR GLACIER W/PUMP (MISCELLANEOUS) ×3 IMPLANT
CUFF TOURN 24 STER (MISCELLANEOUS) IMPLANT
CUFF TOURN 30 STER DUAL PORT (MISCELLANEOUS) ×3 IMPLANT
DRAPE SHEET LG 3/4 BI-LAMINATE (DRAPES) ×3 IMPLANT
DRSG DERMACEA 8X12 NADH (GAUZE/BANDAGES/DRESSINGS) ×3 IMPLANT
DRSG OPSITE POSTOP 4X14 (GAUZE/BANDAGES/DRESSINGS) ×3 IMPLANT
DRSG TEGADERM 4X4.75 (GAUZE/BANDAGES/DRESSINGS) ×3 IMPLANT
DURAPREP 26ML APPLICATOR (WOUND CARE) ×6 IMPLANT
ELECT CAUTERY BLADE 6.4 (BLADE) ×3 IMPLANT
ELECT REM PT RETURN 9FT ADLT (ELECTROSURGICAL) ×3
ELECTRODE REM PT RTRN 9FT ADLT (ELECTROSURGICAL) ×1 IMPLANT
EX-PIN ORTHOLOCK NAV 4X150 (PIN) ×6 IMPLANT
GLOVE BIOGEL M STRL SZ7.5 (GLOVE) ×12 IMPLANT
GLOVE BIOGEL PI IND STRL 9 (GLOVE) ×1 IMPLANT
GLOVE BIOGEL PI INDICATOR 9 (GLOVE) ×2
GLOVE INDICATOR 8.0 STRL GRN (GLOVE) ×9 IMPLANT
GLOVE SURG SYN 9.0  PF PI (GLOVE) ×2
GLOVE SURG SYN 9.0 PF PI (GLOVE) ×1 IMPLANT
GOWN STRL REUS W/ TWL LRG LVL3 (GOWN DISPOSABLE) ×2 IMPLANT
GOWN STRL REUS W/TWL 2XL LVL3 (GOWN DISPOSABLE) ×3 IMPLANT
GOWN STRL REUS W/TWL LRG LVL3 (GOWN DISPOSABLE) ×4
HEMOVAC 400CC 10FR (MISCELLANEOUS) ×3 IMPLANT
HOLDER FOLEY CATH W/STRAP (MISCELLANEOUS) ×3 IMPLANT
HOOD PEEL AWAY FLYTE STAYCOOL (MISCELLANEOUS) ×6 IMPLANT
KIT TURNOVER KIT A (KITS) ×3 IMPLANT
KNIFE SCULPS 14X20 (INSTRUMENTS) ×3 IMPLANT
LABEL OR SOLS (LABEL) ×3 IMPLANT
NDL SAFETY ECLIPSE 18X1.5 (NEEDLE) ×1 IMPLANT
NEEDLE HYPO 18GX1.5 SHARP (NEEDLE) ×2
NEEDLE SPNL 20GX3.5 QUINCKE YW (NEEDLE) ×6 IMPLANT
NS IRRIG 500ML POUR BTL (IV SOLUTION) ×3 IMPLANT
PACK TOTAL KNEE (MISCELLANEOUS) ×3 IMPLANT
PAD WRAPON POLAR KNEE (MISCELLANEOUS) ×1 IMPLANT
PIN DRILL QUICK PACK ×3 IMPLANT
PIN FIXATION 1/8DIA X 3INL (PIN) ×9 IMPLANT
PULSAVAC PLUS IRRIG FAN TIP (DISPOSABLE) ×3
SOL .9 NS 3000ML IRR  AL (IV SOLUTION) ×2
SOL .9 NS 3000ML IRR UROMATIC (IV SOLUTION) ×1 IMPLANT
SOL PREP PVP 2OZ (MISCELLANEOUS) ×3
SOLUTION PREP PVP 2OZ (MISCELLANEOUS) ×1 IMPLANT
SPONGE DRAIN TRACH 4X4 STRL 2S (GAUZE/BANDAGES/DRESSINGS) ×3 IMPLANT
STAPLER SKIN PROX 35W (STAPLE) ×3 IMPLANT
STOCKINETTE IMPERV 14X48 (MISCELLANEOUS) ×3 IMPLANT
STRAP TIBIA SHORT (MISCELLANEOUS) ×3 IMPLANT
SUCTION FRAZIER HANDLE 10FR (MISCELLANEOUS) ×2
SUCTION TUBE FRAZIER 10FR DISP (MISCELLANEOUS) ×1 IMPLANT
SUT VIC AB 0 CT1 36 (SUTURE) ×3 IMPLANT
SUT VIC AB 1 CT1 36 (SUTURE) ×6 IMPLANT
SUT VIC AB 2-0 CT2 27 (SUTURE) ×3 IMPLANT
SYR 20CC LL (SYRINGE) ×3 IMPLANT
SYR 30ML LL (SYRINGE) ×6 IMPLANT
TIP FAN IRRIG PULSAVAC PLUS (DISPOSABLE) ×1 IMPLANT
TOWEL OR 17X26 4PK STRL BLUE (TOWEL DISPOSABLE) ×3 IMPLANT
TOWER CARTRIDGE SMART MIX (DISPOSABLE) ×3 IMPLANT
TRAY FOLEY MTR SLVR 16FR STAT (SET/KITS/TRAYS/PACK) ×3 IMPLANT
WRAPON POLAR PAD KNEE (MISCELLANEOUS) ×3

## 2017-12-10 NOTE — NC FL2 (Signed)
  [place] LEVEL OF CARE SCREENING TOOL     IDENTIFICATION  Patient Name: Pamela Conley Birthdate: July 18, 1940 Sex: female Admission Date (Current Location): 12/10/2017  [place] and [place] Number:  Engineering geologist and Address:  [facility], [address] [place], [place] [address]      Provider Number: [id number]  Attending Physician Name and Address:  Dereck Leep, MD  Relative Name and Phone Number:       Current Level of Care: Hospital Recommended Level of Care: [place] Prior Approval Number:    Date Approved/Denied:   PASRR Number: [id number])  Discharge Plan: SNF    Current Diagnoses: Patient Active Problem List   Diagnosis Date Noted  . S/P total knee arthroplasty 12/10/2017  . Asymptomatic PVCs 11/28/2015  . SOB (shortness of breath) 11/28/2015  . Primary osteoarthritis of right knee 10/14/2014  . Anemia, unspecified 01/04/2014  . Benign essential hypertension 01/04/2014  . GERD (gastroesophageal reflux disease) 01/04/2014  . Hyperlipemia, mixed 01/04/2014  . Hypothyroidism, adult 01/04/2014  . Mild renal insufficiency 01/04/2014  . Venous insufficiency 01/04/2014    Orientation RESPIRATION BLADDER Height & Weight     Self, Time, Situation, Place  Normal Continent Weight:   Height:     BEHAVIORAL SYMPTOMS/MOOD NEUROLOGICAL BOWEL NUTRITION STATUS      Continent Diet(Diet: NPO for surgery. )  AMBULATORY STATUS COMMUNICATION OF NEEDS Skin   Extensive Assist Verbally Surgical wounds(Incision: Right Knee. )                       Personal Care Assistance Level of Assistance  Bathing, Feeding, Dressing Bathing Assistance: Limited assistance Feeding assistance: Independent Dressing Assistance: Limited assistance     Functional Limitations Info  Sight, Hearing, Speech Sight Info: Adequate Hearing Info: Adequate Speech Info: Adequate    SPECIAL CARE FACTORS FREQUENCY  PT (By  licensed PT), OT (By licensed OT)     PT Frequency: (5) OT Frequency: (5)            Contractures      Additional Factors Info  Code Status, Allergies Code Status Info: (not on file. ) Allergies Info: (Esomeprazole Magnesium)           Current Medications (12/10/2017):  This is the current hospital active medication list Current Facility-Administered Medications  Medication Dose Route Frequency Provider Last Rate Last Dose  . ceFAZolin (ANCEF) 2-4 GM/100ML-% IVPB           . chlorhexidine (HIBICLENS) 4 % liquid 4 application  60 mL Topical Once Hooten, Laurice Record, MD      . fentaNYL (SUBLIMAZE) injection 25-50 mcg  25-50 mcg Intravenous Q5 min PRN Piscitello, Precious Haws, MD      . oxyCODONE (Oxy IR/ROXICODONE) immediate release tablet 5 mg  5 mg Oral Once PRN Piscitello, Precious Haws, MD       Or  . oxyCODONE (ROXICODONE) 5 MG/5ML solution 5 mg  5 mg Oral Once PRN Piscitello, Precious Haws, MD         Discharge Medications: Please see discharge summary for a list of discharge medications.  Relevant Imaging Results:  Relevant Lab Results:   Additional Information (SSN: [id number])  Evoleth Nordmeyer, Veronia Beets, LCSW

## 2017-12-10 NOTE — Clinical Social Work Note (Signed)
Clinical Social Work Assessment  Patient Details  Name: Pamela Conley MRN: [id number] Date of Birth: [birth year]  Date of referral:  12/10/17               Reason for consult:  Facility Placement                Permission sought to share information with:  Chartered certified accountant granted to share information::  Yes, Verbal Permission Granted  Name::      [place]::   [place]   Relationship::     Contact Information:     Housing/Transportation Living arrangements for the past 2 months:  [place] of Information:  Patient, Adult Children Patient Interpreter Needed:  None Criminal Activity/Legal Involvement Pertinent to Current Situation/Hospitalization:  No - Comment as needed Significant Relationships:  Adult Children Lives with:  Self Do you feel safe going back to the place where you live?  Yes Need for family participation in patient care:  Yes (Comment)  Care giving concerns:  Patient lives alone in [place].    Social Worker assessment / plan:  Holiday representative (CSW) received SNF consult. PT is pending, patient is post op day 0 today. CSW met with patient and her 2 adult daughters Pamela Conley and Pamela Conley were at bedside. Patient was alert and oriented X4 and was sitting up in the bed. CSW introduced self and explained role of CSW department. Per patient she lives alone and has 2 adult daughters. CSW explained that PT will evaluate patient and make a recommendation of home health or SNF. Per patient she prefers to go home with home health however she would like to go to [facility] if SNF is needed. CSW explained that [facility] will have to approve SNF. Patient is okay with a semi-private room at [facility]. FL2 complete and faxed out.   CSW presented bed offers and patient chose [facility] admissions coordinator at [facility] is aware of above and stated that she will start [facility] SNF authorization. CSW will  continue to follow and assist as needed.    Employment status:  Retired Nurse, adult PT Recommendations:  Not assessed at this time Information / Referral to community resources:  [place]  Patient/Family's Response to care:  Patient chose [facility]   Patient/Family's Understanding of and Emotional Response to Diagnosis, Current Treatment, and Prognosis:  Patient was very pleasant and thanked CSW for assistance.    Emotional Assessment Appearance:  Appears stated age Attitude/Demeanor/Rapport:    Affect (typically observed):  Accepting, Adaptable, Pleasant Orientation:  Oriented to Self, Oriented to Place, Oriented to  Time, Oriented to Situation Alcohol / Substance use:  Not Applicable Psych involvement (Current and /or in the community):  No (Comment)  Discharge Needs  Concerns to be addressed:  Discharge Planning Concerns Readmission within the last 30 days:  No Current discharge risk:  Dependent with Mobility Barriers to Discharge:  Continued Medical Work up   [employer], Veronia Beets, LCSW 12/10/2017, 2:07 PM

## 2017-12-10 NOTE — Clinical Social Work Placement (Signed)
   CLINICAL SOCIAL WORK PLACEMENT  NOTE  Date:  12/10/2017  Patient Details  Name: Pamela Conley MRN: [id number] Date of Birth: Dec 02, 1940  Clinical Social Work is seeking post-discharge placement for this patient at the [place] level of care (*CSW will initial, date and re-position this form in  chart as items are completed):  Yes   Patient/family provided with [place] Work Department's list of facilities offering this level of care within the geographic area requested by the patient (or if unable, by the patient's family).  Yes   Patient/family informed of their freedom to choose among providers that offer the needed level of care, that participate in Medicare, Medicaid or managed care program needed by the patient, have an available bed and are willing to accept the patient.  Yes   Patient/family informed of [facility]'s ownership interest in [facility] and [facility], as well as of the fact that they are under no obligation to receive care at these facilities.  PASRR submitted to EDS on 12/10/17     PASRR number received on 12/10/17     Existing PASRR number confirmed on       FL2 transmitted to all facilities in geographic area requested by pt/family on 12/10/17     FL2 transmitted to all facilities within larger geographic area on       Patient informed that his/her managed care company has contracts with or will negotiate with certain facilities, including the following:        Yes   Patient/family informed of bed offers received.  Patient chooses bed at [facility])     Physician recommends and patient chooses bed at      Patient to be transferred to   on  .  Patient to be transferred to facility by       Patient family notified on   of transfer.  Name of family member notified:        PHYSICIAN       Additional Comment:    _______________________________________________ Shavar Gorka, Veronia Beets,  LCSW 12/10/2017, 2:06 PM

## 2017-12-10 NOTE — Op Note (Signed)
OPERATIVE NOTE  DATE OF SURGERY:  12/10/2017  PATIENT NAME:  Pamela Conley   DOB: 1940/05/24  MRN: [id number]  PRE-OPERATIVE DIAGNOSIS: Degenerative arthrosis of the right knee, primary  POST-OPERATIVE DIAGNOSIS:  Same  PROCEDURE:  Right total knee arthroplasty using computer-assisted navigation  SURGEON:  Marciano Sequin. M.D.  ASSISTANT:  Vance Peper, PA (present and scrubbed throughout the case, critical for assistance with exposure, retraction, instrumentation, and closure)  ANESTHESIA: spinal  ESTIMATED BLOOD LOSS: 50 mL  FLUIDS REPLACED: 1400 mL of crystalloid  TOURNIQUET TIME: 97 minutes  DRAINS: 2 medium Hemovac drains  SOFT TISSUE RELEASES: Anterior cruciate ligament, posterior cruciate ligament, deep medial collateral ligament, patellofemoral ligament, posterolateral corner, and pie crusting of the IT band  IMPLANTS UTILIZED: DePuy Attune size 5 posterior stabilized femoral component (cemented), size 6 rotating platform tibial component (cemented), 38 mm medialized dome patella (cemented), and a 10 mm stabilized rotating platform polyethylene insert.  INDICATIONS FOR SURGERY: Pamela Conley is a 77 y.o. year old female with a long history of progressive knee pain. X-rays demonstrated severe degenerative changes in tricompartmental fashion. The patient had not seen any significant improvement despite conservative nonsurgical intervention. After discussion of the risks and benefits of surgical intervention, the patient expressed understanding of the risks benefits and agree with plans for total knee arthroplasty.   The risks, benefits, and alternatives were discussed at length including but not limited to the risks of infection, bleeding, nerve injury, stiffness, blood clots, the need for revision surgery, cardiopulmonary complications, among others, and they were willing to proceed.  PROCEDURE IN DETAIL: The patient was brought into the operating room and, after  adequate spinal anesthesia was achieved, a tourniquet was placed on the patient's upper thigh. The patient's knee and leg were cleaned and prepped with alcohol and DuraPrep and draped in the usual sterile fashion. A "timeout" was performed as per usual protocol. The lower extremity was exsanguinated using an Esmarch, and the tourniquet was inflated to 300 mmHg. An anterior longitudinal incision was made followed by a standard mid vastus approach. The deep fibers of the medial collateral ligament were elevated in a subperiosteal fashion off of the medial flare of the tibia so as to maintain a continuous soft tissue sleeve. The patella was subluxed laterally and the patellofemoral ligament was incised. Inspection of the knee demonstrated severe degenerative changes with full-thickness loss of articular cartilage. Osteophytes were debrided using a rongeur. Anterior and posterior cruciate ligaments were excised. Two 4.0 mm Schanz pins were inserted in the femur and into the tibia for attachment of the array of trackers used for computer-assisted navigation. Hip center was identified using a circumduction technique. Distal landmarks were mapped using the computer. The distal femur and proximal tibia were mapped using the computer. The distal femoral cutting guide was positioned using computer-assisted navigation so as to achieve a 5 distal valgus cut. The femur was sized and it was felt that a size 5 femoral component was appropriate. A size 5 femoral cutting guide was positioned and the anterior cut was performed and verified using the computer. This was followed by completion of the posterior and chamfer cuts. Femoral cutting guide for the central box was then positioned in the center box cut was performed.  Attention was then directed to the proximal tibia. Medial and lateral menisci were excised. The extramedullary tibial cutting guide was positioned using computer-assisted navigation so as to achieve a 0  varus-valgus alignment and 3 posterior slope. The cut was  performed and verified using the computer. The proximal tibia was sized and it was felt that a size 6 tibial tray was appropriate. Tibial and femoral trials were inserted followed by insertion of an 8 mm polyethylene insert. The knee was felt to be tight laterally.  The trial components were removed and the knee was brought into full extension and distracted using the Moreland retractors.  The posterolateral corner was carefully released using a combination of electrocautery and Metzenbaum scissors.  Trial components were reinserted followed by placement of a 10 mm polyethylene trial.  The knee was still somewhat tight laterally and a scalpel was used to perform a pie crusting release of the IT band.  This allowed for excellent mediolateral soft tissue balancing both in flexion and in full extension. Finally, the patella was cut and prepared so as to accommodate a 38 mm medialized dome patella. A patella trial was placed and the knee was placed through a range of motion with excellent patellar tracking appreciated. The femoral trial was removed after debridement of posterior osteophytes. The central post-hole for the tibial component was reamed followed by insertion of a keel punch. Tibial trials were then removed. Cut surfaces of bone were irrigated with copious amounts of normal saline with antibiotic solution using pulsatile lavage and then suctioned dry. Polymethylmethacrylate cement was prepared in the usual fashion using a vacuum mixer. Cement was applied to the cut surface of the proximal tibia as well as along the undersurface of a size 6 rotating platform tibial component. Tibial component was positioned and impacted into place. Excess cement was removed using [employer] fast streamer. Cement was then applied to the cut surfaces of the femur as well as along the posterior flanges of the size 5 femoral component. The femoral component was positioned and  impacted into place. Excess cement was removed using [employer] fast streamer. A 10 mm polyethylene trial was inserted and the knee was brought into full extension with steady axial compression applied. Finally, cement was applied to the backside of a 38 mm medialized dome patella and the patellar component was positioned and patellar clamp applied. Excess cement was removed using [employer] fast streamer. After adequate curing of the cement, the tourniquet was deflated after a total tourniquet time of 97 minutes. Hemostasis was achieved using electrocautery. The knee was irrigated with copious amounts of normal saline with antibiotic solution using pulsatile lavage and then suctioned dry. 20 mL of 1.3% Exparel and 60 mL of 0.25% Marcaine in 40 mL of normal saline was injected along the posterior capsule, medial and lateral gutters, and along the arthrotomy site. A 10 mm stabilized rotating platform polyethylene insert was inserted and the knee was placed through a range of motion with excellent mediolateral soft tissue balancing appreciated and excellent patellar tracking noted. 2 medium drains were placed in the wound bed and brought out through separate stab incisions. The medial parapatellar portion of the incision was reapproximated using interrupted sutures of #1 Vicryl. Subcutaneous tissue was approximated in layers using first #0 Vicryl followed #2-0 Vicryl. The skin was approximated with skin staples. A sterile dressing was applied.  The patient tolerated the procedure well and was transported to the recovery room in stable condition.    Mikela Senn P. Holley Bouche., M.D.

## 2017-12-10 NOTE — Anesthesia Post-op Follow-up Note (Signed)
Anesthesia QCDR form completed.        

## 2017-12-10 NOTE — Transfer of Care (Signed)
Immediate Anesthesia Transfer of Care Note  Patient: Pamela Conley  Procedure(s) Performed: COMPUTER ASSISTED TOTAL KNEE ARTHROPLASTY (Right )  Patient Location: PACU  Anesthesia Type:Spinal  Level of Consciousness: sedated  Airway & Oxygen Therapy: Patient Spontanous Breathing and Patient connected to face mask oxygen  Post-op Assessment: Report given to RN and Post -op Vital signs reviewed and stable  Post vital signs: Reviewed and stable  Last Vitals:  Vitals Value Taken Time  BP    Temp    Pulse 83 12/10/2017 10:49 AM  Resp 15 12/10/2017 10:49 AM  SpO2 97 % 12/10/2017 10:49 AM  Vitals shown include unvalidated device data.  Last Pain:  Vitals:   12/10/17 0625  TempSrc: Tympanic  PainSc: 4          Complications: No apparent anesthesia complications

## 2017-12-10 NOTE — H&P (Signed)
The patient has been re-examined, and the chart reviewed, and there have been no interval changes to the documented history and physical.    The risks, benefits, and alternatives have been discussed at length. The patient expressed understanding of the risks benefits and agreed with plans for surgical intervention.  James P. Hooten, Jr. M.D.    

## 2017-12-10 NOTE — Evaluation (Signed)
Physical Therapy Evaluation Patient Details Name: Pamela Conley MRN: [id number] DOB: 1941-04-25 Today's Date: 12/10/2017   History of Present Illness  Pt is presenting to hospital S/P R TKA day# 0 with PMH to include HTN, PVD, arthritis, anemia, CKD, and cervical back pain with evidence of disk disease.  Clinical Impression  Pt is a pleasant 77 year old female who was admitted for S/P R TKA with PMH listed above. Pt performs bed mobility with supervision to monitor lines and verbal cues, transfers with Min A at gait belt/UE for stand to sit secondary to dizziness and nausea, sit to stand was PT CGA for safety , and ambulation with Min A at gait belt for dizziness/unsteadiness. Pt demonstrates deficits with functional mobility secondary to pain, weakness, unsteadiness, and dizziness/nausea. Pt would benefit from skilled PT to address above deficits and promote optimal return to PLOF. PT recommends d/c to SNF secondary to deficits mentioned and need for assistance. Pt is motivated and should progress as medical condition/pain improve.     Follow Up Recommendations SNF    Equipment Recommendations  Rolling walker with 5" wheels    Recommendations for Other Services       Precautions / Restrictions Precautions Precautions: Knee Precaution Booklet Issued: No Restrictions Weight Bearing Restrictions: Yes Other Position/Activity Restrictions: WBAT      Mobility  Bed Mobility Overal bed mobility: Needs Assistance Bed Mobility: Supine to Sit     Supine to sit: Supervision     General bed mobility comments: Pt benefited from supervision to monitor lines and verbal cues for scooting in bed.  Transfers Overall transfer level: Needs assistance Equipment used: Rolling walker (2 wheeled) Transfers: Sit to/from Stand Sit to Stand: Min assist         General transfer comment: Pt required CGA at gait belt for saftey and unsteadiness during sit to stand. Pt did require Min A at  gait belt/ue for stand to sit secondary to increased dizziness and nausea.  Ambulation/Gait Ambulation/Gait assistance: Min assist Gait Distance (Feet): 5 Feet Assistive device: Rolling walker (2 wheeled) Gait Pattern/deviations: Shuffle;Step-to pattern     General Gait Details: Pt required Min A at gait belt for unsteadiness and dizziness with ambulation.   Stairs            Wheelchair Mobility    Modified Rankin (Stroke Patients Only)       Balance Overall balance assessment: Needs assistance Sitting-balance support: No upper extremity supported;Feet unsupported Sitting balance-Leahy Scale: Good     Standing balance support: Bilateral upper extremity supported                                 Pertinent Vitals/Pain Pain Assessment: 0-10 Pain Score: 0-No pain(no pain at rest increased to 4/10 with standing) Pain Location: R knee Pain Descriptors / Indicators: Grimacing;Dull Pain Intervention(s): Limited activity within patient's tolerance;Monitored during session;Repositioned;Ice applied    Home Living Family/patient expects to be discharged to:: Private residence Living Arrangements: Alone Available Help at Discharge: Family(intermitent) Type of Home: House Home Access: Stairs to enter Entrance Stairs-Rails: Can reach both Entrance Stairs-Number of Steps: 3(with additional 4th step to enter) Home Layout: One level Home Equipment: Walker - 4 wheels;Cane - single point      Prior Function Level of Independence: Independent with assistive device(s)         Comments: per pt ocasional use of RW     Hand Dominance  Dominant Hand: Right    Extremity/Trunk Assessment   Upper Extremity Assessment Upper Extremity Assessment: Overall WFL for tasks assessed    Lower Extremity Assessment Lower Extremity Assessment: Generalized weakness(R LE grossly 3+/5 MMT)       Communication   Communication: No difficulties  Cognition  Arousal/Alertness: Awake/alert Behavior During Therapy: WFL for tasks assessed/performed Overall Cognitive Status: Within Functional Limits for tasks assessed                                 General Comments: Pt had drowsiness during Eval but WFL for cognition      General Comments      Exercises Total Joint Exercises Goniometric ROM: R Knee AAROM 8-75 degrees Other Exercises Other Exercises: Pt was instructed in supine there.ex to include ankle pumps x10, quad/glute sets x10, heel slides x5, hip abd/add x10, and SLR x10 all on R LE. no physical assist but verbal cueing for proper completion   Assessment/Plan    PT Assessment Patient needs continued PT services  PT Problem List Decreased strength;Decreased activity tolerance;Decreased range of motion;Decreased balance;Decreased mobility;Decreased knowledge of use of DME;Pain       PT Treatment Interventions DME instruction;Gait training;Stair training;Functional mobility training;Therapeutic activities;Therapeutic exercise;Balance training;Neuromuscular re-education    PT Goals (Current goals can be found in the Care Plan section)  Acute Rehab PT Goals Patient Stated Goal: return to PLOF PT Goal Formulation: With patient Time For Goal Achievement: 12/24/17 Potential to Achieve Goals: Fair    Frequency BID   Barriers to discharge Decreased caregiver support(lives alone)      Co-evaluation               AM-PAC PT "6 Clicks" Daily Activity  Outcome Measure Difficulty turning over in bed (including adjusting bedclothes, sheets and blankets)?: A Lot Difficulty moving from lying on back to sitting on the side of the bed? : A Little Difficulty sitting down on and standing up from a chair with arms (e.g., wheelchair, bedside commode, etc,.)?: Unable Help needed moving to and from a bed to chair (including a wheelchair)?: A Little Help needed walking in hospital room?: A Lot Help needed climbing 3-5 steps  with a railing? : A Lot 6 Click Score: 13    End of Session Equipment Utilized During Treatment: Gait belt Activity Tolerance: Patient tolerated treatment well;Patient limited by pain(limited by dizzy/nausea) Patient left: in chair;with call bell/phone within reach;with chair alarm set;with family/visitor present;with SCD's reapplied Nurse Communication: Mobility status;Patient requests pain meds PT Visit Diagnosis: Unsteadiness on feet (R26.81);Other abnormalities of gait and mobility (R26.89);Muscle weakness (generalized) (M62.81);Difficulty in walking, not elsewhere classified (R26.2);Pain Pain - Right/Left: Right Pain - part of body: Knee    Time: 8032-1224 PT Time Calculation (min) (ACUTE ONLY): 41 min   Charges:   PT Evaluation $PT Eval Low Complexity: 1 Low PT Treatments $Therapeutic Exercise: 23-37 mins   PT G Codes:        Rosario Adie, SPT   Rosario Adie 12/10/2017, 5:15 PM

## 2017-12-10 NOTE — Anesthesia Preprocedure Evaluation (Signed)
Anesthesia Evaluation  Patient identified by MRN, date of birth, ID band Patient awake    Reviewed: Allergy & Precautions, H&P , NPO status , Patient's Chart, lab work & pertinent test results  History of Anesthesia Complications (+) PONV and history of anesthetic complications  Airway Mallampati: III  TM Distance: <3 FB Neck ROM: limited    Dental  (+) Chipped, Poor Dentition   Pulmonary neg pulmonary ROS, neg shortness of breath,           Cardiovascular Exercise Tolerance: Good hypertension, (-) angina+ Peripheral Vascular Disease  (-) Past MI and (-) DOE + dysrhythmias      Neuro/Psych  Headaches, negative psych ROS   GI/Hepatic Neg liver ROS, GERD  Medicated and Controlled,  Endo/Other  Hypothyroidism   Renal/GU Renal disease     Musculoskeletal  (+) Arthritis ,   Abdominal   Peds  Hematology negative hematology ROS (+)   Anesthesia Other Findings Past Medical History: No date: Anemia     Comment:  in the past No date: Arthritis     Comment:  joints 2000: Cancer (HCC)     Comment:  skin No date: 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 No date: Chronic kidney disease No date: Dysrhythmia     Comment:  pvc No date: GERD (gastroesophageal reflux disease) No date: Headache     Comment:  migraines in the past No date: Hyperlipidemia No date: Hypertension No date: Hypothyroidism No date: Peripheral vascular disease (HCC) No date: PONV (postoperative nausea and vomiting) No date: Subdural hematoma (HCC) No date: Superficial thrombophlebitis No date: Venous (peripheral) insufficiency No date: Venous insufficiency  Past Surgical History: 1948: APPENDECTOMY No date: COLONOSCOPY 09/24/2017: COLONOSCOPY WITH PROPOFOL; N/A     Comment:  Procedure: COLONOSCOPY WITH PROPOFOL;  Surgeon: Manya Silvas, MD;  Location: [facility] ENDOSCOPY;  Service:               Endoscopy;  Laterality:  N/A; No date: DILATION AND CURETTAGE OF UTERUS No date: edg 06/2017: HERNIA REPAIR     Comment:  inguinal hernia 07/11/2017: INGUINAL HERNIA REPAIR; Right     Comment:  Procedure: HERNIA REPAIR INGUINAL ADULT;  Surgeon:               Leonie Green, MD;  Location: [facility] ORS;  Service:               General;  Laterality: Right; No date: KNEE ARTHROSCOPY; Right No date: skin cancer removed from face 2011: [place]     Comment:  patient hit head with trunk. no residual effects     Reproductive/Obstetrics negative OB ROS                             Anesthesia Physical Anesthesia Plan  ASA: III  Anesthesia Plan: Spinal   Post-op Pain Management:    Induction:   PONV Risk Score and Plan:   Airway Management Planned: Natural Airway and Nasal Cannula  Additional Equipment:   Intra-op Plan:   Post-operative Plan:   Informed Consent: I have reviewed the patients History and Physical, chart, labs and discussed the procedure including the risks, benefits and alternatives for the proposed anesthesia with the patient or authorized representative who has indicated his/her understanding and acceptance.   Dental Advisory Given  Plan Discussed with: Anesthesiologist, CRNA and  Surgeon  Anesthesia Plan Comments: (Patient reports no bleeding problems and no anticoagulant use.  Plan for spinal with backup GA  Patient consented for risks of anesthesia including but not limited to:  - adverse reactions to medications - risk of bleeding, infection, nerve damage and headache - risk of failed spinal - damage to teeth, lips or other oral mucosa - sore throat or hoarseness - Damage to heart, brain, lungs or loss of life  Patient voiced understanding.)        Anesthesia Quick Evaluation

## 2017-12-10 NOTE — Anesthesia Procedure Notes (Signed)
Spinal  Patient location during procedure: OR Start time: 12/10/2017 7:13 AM End time: 12/10/2017 7:16 AM Staffing Resident/CRNA: Nelda Marseille, CRNA Performed: resident/CRNA  Preanesthetic Checklist Completed: patient identified, site marked, surgical consent, pre-op evaluation, timeout performed, IV checked, risks and benefits discussed and monitors and equipment checked Spinal Block Patient position: sitting Prep: Betadine Patient monitoring: heart rate, continuous pulse ox, blood pressure and cardiac monitor Approach: midline Location: L3-4 Injection technique: single-shot Needle Needle type: Whitacre and Introducer  Needle gauge: 25 G Needle length: 9 cm Assessment Sensory level: T10 Additional Notes Negative paresthesia. Negative blood return. Positive free-flowing CSF. Expiration date of kit checked and confirmed. Patient tolerated procedure well, without complications.

## 2017-12-11 ENCOUNTER — Encounter
Admission: RE | Admit: 2017-12-11 | Discharge: 2017-12-11 | Disposition: A | Discharge status: Home / Self Care | Payer: Medicare Other | Source: Ambulatory Visit | Attending: Internal Medicine | Admitting: Internal Medicine

## 2017-12-11 MED ORDER — OXYCODONE HCL 5 MG PO TABS: 5.0000 mg | ORAL_TABLET | ORAL | 0 refills | Status: DC | PRN

## 2017-12-11 MED ORDER — TRAMADOL HCL 50 MG PO TABS: 50.0000 mg | ORAL_TABLET | Freq: Four times a day (QID) | ORAL | 0 refills | Status: DC | PRN

## 2017-12-11 NOTE — Progress Notes (Signed)
Physical Therapy Treatment Patient Details Name: Pamela Conley MRN: [id number] DOB: 1941/05/09 Today's Date: 12/11/2017    History of Present Illness 77yo female pt s/p R TKA with PMH to include HTN, PVD, arthritis, anemia, CKD, and cervical back pain with evidence of disk disease. POD#1    PT Comments    Pt is progressing well towards goals, and PT will attempt to reassess during afternoon treatment. Pt was able to perform bed mobility with MOD I, transfer sit<>stand with CGA, and ambulate 100' with CGA/chair follow/2 standing rest breaks. Pt is motivated to work with PT and will likely continue to progress well. PT change d/c recommendation to Home with home health PT secondary to progress towards goals.   Follow Up Recommendations  Home health PT     Equipment Recommendations  Rolling walker with 5" wheels    Recommendations for Other Services       Precautions / Restrictions Precautions Precautions: Knee Precaution Booklet Issued: Yes (comment) Restrictions Weight Bearing Restrictions: Yes Other Position/Activity Restrictions: WBAT    Mobility  Bed Mobility Overal bed mobility: Needs Assistance Bed Mobility: Supine to Sit     Supine to sit: Modified independent (Device/Increase time)     General bed mobility comments: additional time/effort but able to perform without physical assist  Transfers Overall transfer level: Needs assistance Equipment used: Rolling walker (2 wheeled) Transfers: Sit to/from Stand Sit to Stand: Min guard         General transfer comment: verbal cueing for proper hand placement  Ambulation/Gait Ambulation/Gait assistance: Min guard Gait Distance (Feet): 100 Feet Assistive device: Rolling walker (2 wheeled) Gait Pattern/deviations: Step-through pattern     General Gait Details: pt required CGA secondary for safety. Pt ambulated 100 feet with RW, 2 standing rest breaks, and chair follow. Pt was able to start normalizeing gait  to step through pattern with a decreased L stride length.   Stairs             Wheelchair Mobility    Modified Rankin (Stroke Patients Only)       Balance Overall balance assessment: Needs assistance Sitting-balance support: No upper extremity supported;Feet unsupported Sitting balance-Leahy Scale: Good     Standing balance support: Bilateral upper extremity supported Standing balance-Leahy Scale: Fair                              Cognition Arousal/Alertness: Awake/alert Behavior During Therapy: WFL for tasks assessed/performed Overall Cognitive Status: Within Functional Limits for tasks assessed                                        Exercises Total Joint Exercises Goniometric ROM: R Knee AAROM 6-84 deg Other Exercises Other Exercises: pt instructed in supine there.ex to include ankle pumps, quad/glute sets, heel slides, hip abd/add, and SLR all x12 wit no physical assist.    General Comments        Pertinent Vitals/Pain Pain Assessment: 0-10 Pain Score: 3  Pain Location: R knee Pain Descriptors / Indicators: Grimacing;Dull Pain Intervention(s): Limited activity within patient's tolerance;Monitored during session;Repositioned;Ice applied    Home Living                      Prior Function            PT Goals (current goals can now be  found in the care plan section) Acute Rehab PT Goals Patient Stated Goal: return to PLOF PT Goal Formulation: With patient Time For Goal Achievement: 12/24/17 Potential to Achieve Goals: Fair Progress towards PT goals: Progressing toward goals    Frequency    BID      PT Plan Discharge plan needs to be updated    Co-evaluation              AM-PAC PT "6 Clicks" Daily Activity  Outcome Measure  Difficulty turning over in bed (including adjusting bedclothes, sheets and blankets)?: A Lot Difficulty moving from lying on back to sitting on the side of the bed? : A  Little Difficulty sitting down on and standing up from a chair with arms (e.g., wheelchair, bedside commode, etc,.)?: Unable Help needed moving to and from a bed to chair (including a wheelchair)?: A Little Help needed walking in hospital room?: A Little Help needed climbing 3-5 steps with a railing? : A Lot 6 Click Score: 14    End of Session Equipment Utilized During Treatment: Gait belt Activity Tolerance: Patient tolerated treatment well;No increased pain;Patient limited by fatigue Patient left: in chair;with call bell/phone within reach;with chair alarm set;with family/visitor present;with SCD's reapplied Nurse Communication: Mobility status PT Visit Diagnosis: Unsteadiness on feet (R26.81);Other abnormalities of gait and mobility (R26.89);Muscle weakness (generalized) (M62.81);Difficulty in walking, not elsewhere classified (R26.2);Pain Pain - Right/Left: Right Pain - part of body: Knee     Time: 3953-2023 PT Time Calculation (min) (ACUTE ONLY): 30 min  Charges:                        Pamela Conley, SPT    Pamela Conley 12/11/2017, 1:01 PM

## 2017-12-11 NOTE — Evaluation (Signed)
Occupational Therapy Evaluation Patient Details Name: Pamela Conley MRN: [id number] DOB: [birth year] Today's Date: 12/11/2017    History of Present Illness 77yo female pt s/p R TKA with PMH to include HTN, PVD, arthritis, anemia, CKD, and cervical back pain with evidence of disk disease. PT evaluation completed POD#0, OT evaluation completed POD#1.   Clinical Impression   Pt seen for OT evaluation this date, POD#1 from above surgery. Pt was independent in all ADLs prior to surgery, and becoming increasingly limited by R knee pain over past 4 months with walking longer distances, which she enjoyed as a form of exercise. Pt is eager to return to PLOF with less pain and improved safety and independence. Pt currently requires minimal assist for LB dressing and bathing while in seated position due to pain and limited AROM of R knee. Pt and daughter instructed in polar care mgt, falls prevention strategies, home/routines modifications, DME/AE for LB bathing and dressing tasks, and compression stocking mgt. Pt would benefit from skilled OT services including additional instruction in dressing techniques with or without assistive devices for dressing and bathing skills to support recall and carryover prior to discharge and ultimately to maximize safety, independence, and minimize falls risk and caregiver burden. Recommend HHOT services upon discharge to support implementation of learned skills, use of AE, and minimizing falls risk and caregiver burden.       Follow Up Recommendations  Home health OT    Equipment Recommendations  None recommended by OT    Recommendations for Other Services       Precautions / Restrictions Precautions Precautions: Knee Precaution Comments: no KI needed, pt able to perform SLR independently Restrictions Weight Bearing Restrictions: Yes Other Position/Activity Restrictions: WBAT      Mobility Bed Mobility Overal bed mobility: Needs Assistance Bed Mobility:  Supine to Sit     Supine to sit: Modified independent (Device/Increase time)     General bed mobility comments: additional time/effort but able to perform without physical assist  Transfers Overall transfer level: Needs assistance Equipment used: Rolling walker (2 wheeled) Transfers: Sit to/from Stand Sit to Stand: Min guard         General transfer comment: initial VC for hand placement with good follow through     Balance Overall balance assessment: Needs assistance Sitting-balance support: No upper extremity supported;Feet unsupported Sitting balance-Leahy Scale: Good     Standing balance support: Bilateral upper extremity supported Standing balance-Leahy Scale: Fair                             ADL either performed or assessed with clinical judgement   ADL Overall ADL's : Needs assistance/impaired Eating/Feeding: Independent;Sitting   Grooming: Independent;Sitting   Upper Body Bathing: Sitting;Supervision/ safety   Lower Body Bathing: Sit to/from stand;Minimal assistance;With caregiver independent assisting Lower Body Bathing Details (indicate cue type and reason): pt/dtr instructed in benefits of a seated shower once cleared by MD to shower Upper Body Dressing : Sitting;Independent   Lower Body Dressing: Sit to/from stand;Minimal assistance;With caregiver independent assisting Lower Body Dressing Details (indicate cue type and reason): pt/dtr instructed in AE for LB dressing to maximize independence; instructed in compression stockings  Toilet Transfer: RW;Ambulation;Min guard Toilet Transfer Details (indicate cue type and reason): BSC over toilet (will have toilet riser w/ bilat rails on standard height toilet at home)         Functional mobility during ADLs: Min guard;Rolling walker  Vision Baseline Vision/History: Wears glasses Wears Glasses: At all times Patient Visual Report: No change from baseline       Perception     Praxis       Pertinent Vitals/Pain Pain Assessment: 0-10 Pain Score: 5  Pain Location: R knee Pain Descriptors / Indicators: Grimacing;Dull Pain Intervention(s): Limited activity within patient's tolerance;Monitored during session;Repositioned;Ice applied;Premedicated before session     Hand Dominance Right   Extremity/Trunk Assessment Upper Extremity Assessment Upper Extremity Assessment: Overall WFL for tasks assessed   Lower Extremity Assessment Lower Extremity Assessment: Defer to PT evaluation(expected post-op strength/ROM deficits in RLE)   Cervical / Trunk Assessment Cervical / Trunk Assessment: Normal   Communication Communication Communication: No difficulties   Cognition Arousal/Alertness: Awake/alert Behavior During Therapy: WFL for tasks assessed/performed Overall Cognitive Status: Within Functional Limits for tasks assessed                                     General Comments       Exercises Other Exercises Other Exercises: Pt/dtr instructed in home/routines modifications and falls prevention strategies to maximize safety and independence while recovering.  Other Exercises: Pt/dtr instructed in polar care mgt to maximize adherence with recovery   Shoulder Instructions      Home Living Family/patient expects to be discharged to:: Private residence Living Arrangements: Alone Available Help at Discharge: Family;Available 24 hours/day;Available PRN/intermittently(2 daughters going to help provide 24/7 assist then PRN ) Type of Home: House Home Access: Stairs to enter [employer] of Steps: 3+1 Entrance Stairs-Rails: Can reach both Home Layout: One level     Bathroom Shower/Tub: Occupational psychologist: Heritage manager w/ bilat rails on top) Bathroom Accessibility: Yes   Home Equipment: Environmental consultant - 4 wheels;Cane - single point;Toilet riser;Adaptive equipment Adaptive Equipment: Reacher        Prior Functioning/Environment  Level of Independence: Independent        Comments: Pt indep with mobility, ADL, IADL, including driving. No falls in past 12 months. Enjoyed walking until approx 4 mo ago when her R knee pain became too painful.        OT Problem List: Decreased strength;Decreased knowledge of use of DME or AE;Impaired balance (sitting and/or standing);Pain      OT Treatment/Interventions: Self-care/ADL training;Balance training;Therapeutic activities;DME and/or AE instruction;Patient/family education    OT Goals(Current goals can be found in the care plan section) Acute Rehab OT Goals Patient Stated Goal: return to PLOF OT Goal Formulation: With patient/family Time For Goal Achievement: 12/25/17 Potential to Achieve Goals: Good ADL Goals Pt Will Perform Lower Body Dressing: sit to/from stand;with adaptive equipment;with caregiver independent in assisting;with modified independence Pt Will Transfer to Toilet: with modified independence;ambulating(toilet w/ riser and bilat rails, LRAD for amb) Additional ADL Goal #1: Pt will independently instruct family in polar care mgt including positioning, wear schedule, and donning/doffing. Additional ADL Goal #2: Pt will independently instruct family in compression stocking mgt including positioning, wear schedule, and donning/doffing.  OT Frequency: Min 1X/week   Barriers to D/C:            Co-evaluation              AM-PAC PT "6 Clicks" Daily Activity     Outcome Measure Help from another person eating meals?: None Help from another person taking care of personal grooming?: None Help from another person toileting, which includes using toliet, bedpan, or urinal?: A  Little Help from another person bathing (including washing, rinsing, drying)?: A Little Help from another person to put on and taking off regular upper body clothing?: None Help from another person to put on and taking off regular lower body clothing?: A Little 6 Click Score: 21    End of Session Equipment Utilized During Treatment: Gait belt;Rolling walker  Activity Tolerance: Patient tolerated treatment well Patient left: in chair;with call bell/phone within reach;with chair alarm set;with family/visitor present;Other (comment)(polar care in place)  OT Visit Diagnosis: Other abnormalities of gait and mobility (R26.89);Pain Pain - Right/Left: Right Pain - part of body: Knee                Time: 9292-4462 OT Time Calculation (min): 43 min Charges:  OT General Charges $OT Visit: 1 Visit OT Evaluation $OT Eval Low Complexity: 1 Low OT Treatments $Self Care/Home Management : 23-37 mins  Jeni Salles, MPH, MS, OTR/L ascom [phone] 12/11/17, 9:04 AM

## 2017-12-11 NOTE — Progress Notes (Signed)
Physical Therapy Treatment Patient Details Name: Pamela Conley MRN: [id number] DOB: 05-07-1941 Today's Date: 12/11/2017    History of Present Illness 76yo female pt s/p R TKA with PMH to include HTN, PVD, arthritis, anemia, CKD, and cervical back pain with evidence of disk disease. POD#1    PT Comments    Pt is progressing well towards goals. Pt has tolerated continued progression of there. Pt was able to perform bed mobility with MOD I, transfer sit<>stand with supervision, and ambulate 200' with CGA/chair follow. Pt is motivated to work with PT and will likely continue to progress well. Pt will continue to recommend current d/c plans secondary to progress towards goals.    Follow Up Recommendations  Home health PT     Equipment Recommendations  Rolling walker with 5" wheels    Recommendations for Other Services       Precautions / Restrictions Precautions Precautions: Knee Precaution Booklet Issued: Yes (comment) Restrictions Weight Bearing Restrictions: Yes Other Position/Activity Restrictions: WBAT    Mobility  Bed Mobility Overal bed mobility: Needs Assistance Bed Mobility: Supine to Sit     Supine to sit: Modified independent (Device/Increase time)     General bed mobility comments: additional time/effort but able to perform without physical assist  Transfers Overall transfer level: Needs assistance Equipment used: Rolling walker (2 wheeled) Transfers: Sit to/from Stand Sit to Stand: Supervision         General transfer comment: verbal cueing for proper hand placement  Ambulation/Gait Ambulation/Gait assistance: Min guard Gait Distance (Feet): 200 Feet Assistive device: Rolling walker (2 wheeled) Gait Pattern/deviations: Step-through pattern     General Gait Details: pt required CGA secondary for safety. Pt ambulated 200' with RW, and chair follow. Pt was able to increase stride length and heel strike for normalizing gait.   Stairs              Wheelchair Mobility    Modified Rankin (Stroke Patients Only)       Balance Overall balance assessment: Needs assistance Sitting-balance support: No upper extremity supported;Feet unsupported Sitting balance-Leahy Scale: Good     Standing balance support: Bilateral upper extremity supported Standing balance-Leahy Scale: Fair                              Cognition Arousal/Alertness: Awake/alert Behavior During Therapy: WFL for tasks assessed/performed Overall Cognitive Status: Within Functional Limits for tasks assessed                                        Exercises Total Joint Exercises Goniometric ROM: R Knee AAROM 6-84 deg Other Exercises Other Exercises: pt instructed in supine there.ex to include ankle pumps, quad/glute sets, heel slides, hip abd/add, SLR, LAQ all x15 wit no physical assist.    General Comments        Pertinent Vitals/Pain Pain Assessment: 0-10 Pain Score: 4 (4.5) Pain Location: R knee Pain Descriptors / Indicators: Grimacing;Dull Pain Intervention(s): Limited activity within patient's tolerance;Monitored during session;Repositioned;Patient requesting pain meds-RN notified;Ice applied    Home Living                      Prior Function            PT Goals (current goals can now be found in the care plan section) Acute Rehab PT Goals Patient Stated  Goal: return to PLOF PT Goal Formulation: With patient Time For Goal Achievement: 12/24/17 Potential to Achieve Goals: Fair Progress towards PT goals: Progressing toward goals    Frequency    BID      PT Plan Current plan remains appropriate    Co-evaluation              AM-PAC PT "6 Clicks" Daily Activity  Outcome Measure  Difficulty turning over in bed (including adjusting bedclothes, sheets and blankets)?: A Lot Difficulty moving from lying on back to sitting on the side of the bed? : A Little Difficulty sitting down on and  standing up from a chair with arms (e.g., wheelchair, bedside commode, etc,.)?: A Little Help needed moving to and from a bed to chair (including a wheelchair)?: A Little Help needed walking in hospital room?: A Little Help needed climbing 3-5 steps with a railing? : A Lot 6 Click Score: 16    End of Session Equipment Utilized During Treatment: Gait belt Activity Tolerance: Patient tolerated treatment well;No increased pain;Patient limited by fatigue Patient left: in bed;with call bell/phone within reach;with bed alarm set;with family/visitor present Nurse Communication: Mobility status;Patient requests pain meds PT Visit Diagnosis: Unsteadiness on feet (R26.81);Other abnormalities of gait and mobility (R26.89);Muscle weakness (generalized) (M62.81);Difficulty in walking, not elsewhere classified (R26.2);Pain Pain - Right/Left: Right Pain - part of body: Knee     Time: 4103-0131 PT Time Calculation (min) (ACUTE ONLY): 27 min  Charges:  $Gait Training: 8-22 mins $Therapeutic Exercise: 8-22 mins                     Rosario Adie, SPT    Rosario Adie 12/11/2017, 3:45 PM

## 2017-12-11 NOTE — Progress Notes (Signed)
ORTHOPAEDICS PROGRESS NOTE  PATIENT NAME: Pamela Conley DOB: 1941/04/10  MRN: [id number]  POD # 1: Right total knee arthroplasty  Subjective: The patient rested well last night.  Pain is been under good control. The patient did have some mild nausea and "lightheadedness" when getting up with physical therapy yesterday.  No nausea or vomiting last night.  Objective: Vital signs in last 24 hours: Temp:  [97.3 F (36.3 C)-97.9 F (36.6 C)] 97.6 F (36.4 C) (07/25 0332) Pulse Rate:  [53-91] 53 (07/25 0332) Resp:  [11-19] 19 (07/25 0332) BP: (108-150)/(61-94) 124/71 (07/25 0332) SpO2:  [97 %-100 %] 99 % (07/25 0332)  Intake/Output from previous day: 07/24 0701 - 07/25 0700 In: 4210 [P.O.:120; I.V.:3280; IV Piggyback:810] Out: 2710 [Urine:2350; Drains:310; Blood:50]  Recent Labs    12/10/17 0702  WBC 6.3  HGB 13.2  HCT 38.5  PLT 195    EXAM General: Well-developed well-nourished female seen in no apparent discomfort. Lungs: clear to auscultation Cardiac: normal rate and regular rhythm Right lower extremity: Knee dressing is dry and intact.  Hemovac drain and Polar Care are in place and functioning.  The patient is able to perform an independent straight leg raise.  Homans test is negative. Neurologic: Awake, alert, and oriented.  Sensory and motor function are intact.  Assessment: Right total knee arthroplasty  Secondary diagnoses: Venous insufficiency History of subdural hematoma Peripheral vascular disease Hypothyroidism Hypertension Hyperlipidemia Gastroesophageal reflux disease Chronic kidney disease History of anemia  Plan: Notes from physical therapy were reviewed.  Continue with physical therapy and Occupational Therapy as per total knee arthroplasty rehab protocol. Today's goals were reviewed with the patient.  DVT Prophylaxis - Lovenox, Foot Pumps and TED hose  James P. Holley Bouche M.D.

## 2017-12-11 NOTE — Anesthesia Postprocedure Evaluation (Signed)
Anesthesia Post Note  Patient: Addilynne Olheiser  Procedure(s) Performed: COMPUTER ASSISTED TOTAL KNEE ARTHROPLASTY (Right )  Patient location during evaluation: Nursing Unit Anesthesia Type: Spinal Level of consciousness: awake, awake and alert, oriented and patient cooperative Pain management: pain level controlled Vital Signs Assessment: post-procedure vital signs reviewed and stable Respiratory status: spontaneous breathing, nonlabored ventilation and respiratory function stable Cardiovascular status: stable Postop Assessment: no headache, no backache, patient able to bend at knees, no apparent nausea or vomiting and adequate PO intake Anesthetic complications: no     Last Vitals:  Vitals:   12/10/17 2331 12/11/17 0332  BP: 112/62 124/71  Pulse: (!) 58 (!) 53  Resp: 18 19  Temp: (!) 36.4 C 36.4 C  SpO2: 100% 99%    Last Pain:  Vitals:   12/11/17 0621  TempSrc:   PainSc: 5                  Yomayra Tate,  Kazim Corrales R

## 2017-12-11 NOTE — Discharge Summary (Signed)
Physician Discharge Summary  Patient ID: Pamela Conley MRN: [id number] DOB/AGE: [birth year] 77 y.o.  Admit date: 12/10/2017 Discharge date: 12/12/2017  Admission Diagnoses:  PRIMARY OSTEOARTHRITIS OF RIGHT KNEE   Discharge Diagnoses: Patient Active Problem List   Diagnosis Date Noted  . S/P total knee arthroplasty 12/10/2017  . Asymptomatic PVCs 11/28/2015  . SOB (shortness of breath) 11/28/2015  . Primary osteoarthritis of right knee 10/14/2014  . Anemia, unspecified 01/04/2014  . Benign essential hypertension 01/04/2014  . GERD (gastroesophageal reflux disease) 01/04/2014  . Hyperlipemia, mixed 01/04/2014  . Hypothyroidism, adult 01/04/2014  . Mild renal insufficiency 01/04/2014  . Venous insufficiency 01/04/2014    Past Medical History:  Diagnosis Date  . Anemia    in the past  . Arthritis    joints  . Cancer ([place]) 2000   skin  . 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 Chronic kidney disease   . Dysrhythmia    pvc  . GERD (gastroesophageal reflux disease)   . Headache    migraines in the past  . Hyperlipidemia   . Hypertension   . Hypothyroidism   . Peripheral vascular disease ([place])   . PONV (postoperative nausea and vomiting)   . Subdural hematoma ([place])   . Superficial thrombophlebitis   . Venous (peripheral) insufficiency   . Venous insufficiency      Transfusion: No transfusions during this admission   Consultants (if any):   Discharged Condition: Improved  Hospital Course: Pamela Conley is an 77 y.o. female who was admitted 12/10/2017 with a diagnosis of degenerative arthrosis right knee and went to the operating room on 12/10/2017 and underwent the above named procedures.    Surgeries:Procedure(s): COMPUTER ASSISTED TOTAL KNEE ARTHROPLASTY on 12/10/2017  PRE-OPERATIVE DIAGNOSIS: Degenerative arthrosis of the right knee, primary  POST-OPERATIVE DIAGNOSIS:  Same  PROCEDURE:  Right total knee arthroplasty using  computer-assisted navigation  SURGEON:  Marciano Sequin. M.D.  ASSISTANT:  Vance Peper, PA (present and scrubbed throughout the case, critical for assistance with exposure, retraction, instrumentation, and closure)  ANESTHESIA: spinal  ESTIMATED BLOOD LOSS: 50 mL  FLUIDS REPLACED: 1400 mL of crystalloid  TOURNIQUET TIME: 97 minutes  DRAINS: 2 medium Hemovac drains  SOFT TISSUE RELEASES: Anterior cruciate ligament, posterior cruciate ligament, deep medial collateral ligament, patellofemoral ligament, posterolateral corner, and pie crusting of the IT band  IMPLANTS UTILIZED: DePuy Attune size 5 posterior stabilized femoral component (cemented), size 6 rotating platform tibial component (cemented), 38 mm medialized dome patella (cemented), and a 10 mm stabilized rotating platform polyethylene insert.  INDICATIONS FOR SURGERY: Pamela Conley is a 77 y.o. year old female with a long history of progressive knee pain. X-rays demonstrated severe degenerative changes in tricompartmental fashion. The patient had not seen any significant improvement despite conservative nonsurgical intervention. After discussion of the risks and benefits of surgical intervention, the patient expressed understanding of the risks benefits and agree with plans for total knee arthroplasty.   The risks, benefits, and alternatives were discussed at length including but not limited to the risks of infection, bleeding, nerve injury, stiffness, blood clots, the need for revision surgery, cardiopulmonary complications, among others, and they were willing to proceed.   Patient tolerated the surgery well. No complications .Patient was taken to PACU where she was stabilized and then transferred to the orthopedic floor.  Patient started on Lovenox 30 mg q 12 hrs. Foot pumps applied bilaterally at 80 mm hgb. Heels elevated off bed with rolled  towels. No evidence of DVT. Calves non tender. Negative [place]. Physical  therapy started on day #1 for gait training and transfer with OT starting on  day #1 for ADL and assisted devices. Patient has done well with therapy. Ambulated greater than 200 feet upon being discharged.  Was able to ascend and descend 4 steps safely and independently  Patient's IV And Foley were discontinued on day #1 with Hemovac being discontinued on day #2. Dressing was changed on day 2 prior to patient being discharged   She was given perioperative antibiotics:  Anti-infectives (From admission, onward)   Start     Dose/Rate Route Frequency Ordered Stop   12/10/17 1400  ceFAZolin (ANCEF) IVPB 2g/100 mL premix     2 g 200 mL/hr over 30 Minutes Intravenous Every 6 hours 12/10/17 1222 12/11/17 1359   12/10/17 0625  ceFAZolin (ANCEF) 2-4 GM/100ML-% IVPB    Note to Pharmacy:  Phineas Real   : cabinet override      12/10/17 0625 12/10/17 1844   12/10/17 0600  ceFAZolin (ANCEF) IVPB 2g/100 mL premix  Status:  Discontinued     2 g 200 mL/hr over 30 Minutes Intravenous On call to O.R. 12/09/17 2143 12/10/17 7829    .  She was fitted with AV 1 compression foot pump devices, instructed on heel pumps, early ambulation, and fitted with TED stockings bilaterally for DVT prophylaxis.  She benefited maximally from the hospital stay and there were no complications.    Recent vital signs:  Vitals:   12/10/17 2331 12/11/17 0332  BP: 112/62 124/71  Pulse: (!) 58 (!) 53  Resp: 18 19  Temp: (!) 97.5 F (36.4 C) 97.6 F (36.4 C)  SpO2: 100% 99%    Recent laboratory studies:  Lab Results  Component Value Date   HGB 13.2 12/10/2017   HGB 11.6 (L) 08/19/2009   Lab Results  Component Value Date   WBC 6.3 12/10/2017   PLT 195 12/10/2017   Lab Results  Component Value Date   INR 0.98 11/26/2017   Lab Results  Component Value Date   NA 139 08/19/2009   K 3.9 06/27/2017   CL 110 08/19/2009   CO2 23 08/19/2009   BUN 19 08/19/2009   CREATININE 0.92 08/19/2009   GLUCOSE 146 (H)  08/19/2009    Discharge Medications:   Allergies as of 12/12/2017      Reactions   Esomeprazole Magnesium Hives      Medication List    STOP taking these medications   aspirin EC 81 MG tablet   etodolac 400 MG tablet Commonly known as:  LODINE     TAKE these medications   acetaminophen 500 MG tablet Commonly known as:  TYLENOL Take 1,000 mg by mouth daily as needed for moderate pain or headache.   calcium-vitamin D 500-200 MG-UNIT tablet Take 1 tablet by mouth 2 (two) times daily.   CITRUCEL 500 MG Tabs Generic drug:  Methylcellulose (Laxative) Take 1,000 mg by mouth daily.   enoxaparin 40 MG/0.4ML injection Commonly known as:  LOVENOX Inject 0.4 mLs (40 mg total) into the skin daily for 14 days. Start taking on:  12/13/2017   levothyroxine 75 MCG tablet Commonly known as:  SYNTHROID, LEVOTHROID Take 75 mcg by mouth daily before breakfast.   omeprazole 20 MG capsule Commonly known as:  PRILOSEC Take 20 mg by mouth daily.   oxyCODONE 5 MG immediate release tablet Commonly known as:  Oxy IR/ROXICODONE Take 1 tablet (5 mg total)  by mouth every 4 (four) hours as needed for moderate pain (pain score 4-6).   PROBIOTIC DAILY PO Take 1 capsule by mouth daily.   ramipril 2.5 MG capsule Commonly known as:  ALTACE Take 2.5 mg by mouth daily.   simethicone 80 MG chewable tablet Commonly known as:  MYLICON Chew 80 mg by mouth every 6 (six) hours as needed for flatulence.   SYSTANE 0.4-0.3 % Gel ophthalmic gel Generic drug:  Polyethyl Glycol-Propyl Glycol Place 1 application into both eyes at bedtime.   SYSTANE OP Place 1 drop into both eyes daily as needed (dry eyes).   traMADol 50 MG tablet Commonly known as:  ULTRAM Take 1-2 tablets (50-100 mg total) by mouth every 6 (six) hours as needed for moderate pain.   triamterene-hydrochlorothiazide 37.5-25 MG tablet Commonly known as:  MAXZIDE-25 Take 0.5-1 tablets by mouth daily. Based on the severity of the  swelling   Vitamin D3 1000 units Caps Take 1,000 Units by mouth daily.            Durable Medical Equipment  (From admission, onward)        Start     Ordered   12/10/17 1223  DME Walker rolling  Once    Question:  Patient needs a walker to treat with the following condition  Answer:  Total knee replacement status   12/10/17 1222   12/10/17 1223  DME Bedside commode  Once    Question:  Patient needs a bedside commode to treat with the following condition  Answer:  Total knee replacement status   12/10/17 1222      Diagnostic Studies: Dg Knee Right Port  Result Date: 12/10/2017 CLINICAL DATA:  Total knee replacement EXAM: PORTABLE RIGHT KNEE - 1-2 VIEW COMPARISON:  None. FINDINGS: Changes of right knee replacement. No hardware or bony complicating feature. Soft tissue drains in place. IMPRESSION: Right knee replacement.  No visible complicating feature. Electronically Signed   By: Rolm Baptise M.D.   On: 12/10/2017 11:27    Disposition:   Discharge Instructions    Increase activity slowly   Complete by:  As directed        Contact information for follow-up providers    Watt Climes, PA On 12/23/2017.   Specialty:  Physician Assistant Why:  at 10:15am Contact information: [place] [id number] [phone]        Dereck Leep, MD On 02/05/2018.   Specialty:  Orthopedic Surgery Why:  at 10:15am Contact information: [address] [facility] [place] [address] [place] [address] [phone]            Contact information for after-discharge care    Destination    [facility] .   Service:  Skilled Nursing Contact information: [address] [place] [phone]                   Signed: Watt Climes. 12/11/2017, 7:18 AM

## 2017-12-11 NOTE — Progress Notes (Signed)
PT is recommending home health. Patient is agreeable to D/C home with home health. RN case manager aware of above.   [employer], LCSW [phone]

## 2017-12-11 NOTE — Care Management Note (Signed)
Case Management Note  Patient Details  Name: Pamela Conley MRN: [id number] Date of Birth: [birth year]  Subjective/Objective: PT now recommending home health. Met with patient at bedside to discuss discharge planning. Patient lives at home alone. Her daughters will be taking turns staying with her at discharge. She will need a walker. Ordered from [facility] with [facility]. Offered a list of home health agencies. [facility] prefers [facility] and [facility]. Referral to [facility] for PT/OT and requested Lennette Bihari with [facility]. [facility]  [address]- [phone]. Will check the price of Lovenox in the am. PCP is Dr. Carrie Mew.                  Action/Plan: [facility] for PT/OT, Walker from [facility], Check price of Lovenox in the am.    Expected Discharge Date:  12/12/17               Expected Discharge Plan:  [place]  In-House Referral:     Discharge planning Services  CM Consult  Post Acute Care Choice:  Durable Medical Equipment, Home Health Choice offered to:  Patient  DME Arranged:    DME Agency:  [place]:  PT [place]:  [facility])  Status of Service:  In process, will continue to follow  If discussed at Long Length of Stay Meetings, dates discussed:    Additional Comments:  Jolly Mango, RN 12/11/2017, 4:10 PM

## 2017-12-12 MED ORDER — ENOXAPARIN SODIUM 40 MG/0.4ML ~~LOC~~ SOLN: 40.0000 mg | SUBCUTANEOUS | 0 refills | Status: DC

## 2017-12-12 MED ORDER — LACTULOSE 10 GM/15ML PO SOLN: 10.0000 g | Freq: Two times a day (BID) | ORAL | Status: DC | PRN

## 2017-12-12 NOTE — Care Management (Addendum)
Discharge to home today per Dr. Marry Guan. Will be followed by [facility] for services. Will update Drue Novel [facility] representative. Gilford Rile will be provided by [place]. Requested bedside commode. Corene Cornea updated [facility] [address]. Family will transport Shelbie Ammons RN MSN CCM Care Management [phone]

## 2017-12-12 NOTE — Progress Notes (Signed)
Physical Therapy Treatment Patient Details Name: Pamela Conley MRN: [id number] DOB: May 16, 1941 Today's Date: 12/12/2017    History of Present Illness 77yo female pt s/p R TKA with PMH to include HTN, PVD, arthritis, anemia, CKD, and cervical back pain with evidence of disk disease. POD#1    PT Comments    Pt is progressing well towards goals. Pt has tolerated continued progression of there.ex. Pt was able to perform transfer sit<>stand with supervision, and ambulate 100' with CGA/chair follow/2 seated rest break, and stair training with Min A at gait belt/R LE. Pt benefited from verbal cueing to normalize gait this AM for heel strike and stride length. Pt is limited by increased pain and pt feeling "jumpy". Pt will continue to recommend current d/c plans secondary to progress towards goals and capacity to safely navigate stairs.   Follow Up Recommendations  Home health PT     Equipment Recommendations  Rolling walker with 5" wheels    Recommendations for Other Services       Precautions / Restrictions Precautions Precautions: Knee Precaution Booklet Issued: Yes (comment) Restrictions Weight Bearing Restrictions: Yes Other Position/Activity Restrictions: WBAT    Mobility  Bed Mobility Overal bed mobility: Needs Assistance             General bed mobility comments: deffered secondary to pt being found in chair and pt prefrence to return to bed.  Transfers Overall transfer level: Needs assistance Equipment used: Rolling walker (2 wheeled) Transfers: Sit to/from Stand Sit to Stand: Supervision         General transfer comment: pt required extra time as well  Ambulation/Gait Ambulation/Gait assistance: Min guard Gait Distance (Feet): 100 Feet Assistive device: Rolling walker (2 wheeled) Gait Pattern/deviations: Step-through pattern     General Gait Details: pt required CGA secondary for safety. Pt ambulated 100' with RW, chair follow, and 2 seated rest  breaks. Pt had decreased stride length compared to yesterday that improved with verbal cueing. Pt had diminished gait capacity secondary to increased pain/fatigue   Stairs Stairs: Yes Stairs assistance: Min assist Stair Management: Two rails;Step to pattern Number of Stairs: 5 General stair comments: Pt required Min A at gait belt and LE secondary to pain, weakness, and FOF.   Wheelchair Mobility    Modified Rankin (Stroke Patients Only)       Balance Overall balance assessment: Needs assistance Sitting-balance support: No upper extremity supported;Feet unsupported Sitting balance-Leahy Scale: Good     Standing balance support: Bilateral upper extremity supported Standing balance-Leahy Scale: Fair                              Cognition Arousal/Alertness: Awake/alert Behavior During Therapy: WFL for tasks assessed/performed Overall Cognitive Status: Within Functional Limits for tasks assessed                                        Exercises Total Joint Exercises Goniometric ROM: R Knee AAROM 5-90 deg Other Exercises Other Exercises: pt instructed in supine there.ex to include ankle pumps, quad/glute sets, heel slides, hip abd/add, and seated repeated flexion/LAQ all x15 wit no physical assist.    General Comments        Pertinent Vitals/Pain Pain Assessment: 0-10 Pain Score: 2  Pain Location: R knee Pain Descriptors / Indicators: Grimacing;Dull Pain Intervention(s): Limited activity within patient's tolerance;Monitored during session;Ice applied  Home Living                      Prior Function            PT Goals (current goals can now be found in the care plan section) Acute Rehab PT Goals Patient Stated Goal: return to PLOF PT Goal Formulation: With patient Time For Goal Achievement: 12/24/17 Potential to Achieve Goals: Fair Progress towards PT goals: Progressing toward goals    Frequency    BID      PT  Plan Current plan remains appropriate    Co-evaluation              AM-PAC PT "6 Clicks" Daily Activity  Outcome Measure  Difficulty turning over in bed (including adjusting bedclothes, sheets and blankets)?: A Lot Difficulty moving from lying on back to sitting on the side of the bed? : A Little Difficulty sitting down on and standing up from a chair with arms (e.g., wheelchair, bedside commode, etc,.)?: A Little Help needed moving to and from a bed to chair (including a wheelchair)?: A Little Help needed walking in hospital room?: A Little Help needed climbing 3-5 steps with a railing? : A Little 6 Click Score: 17    End of Session Equipment Utilized During Treatment: Gait belt Activity Tolerance: Patient tolerated treatment well;Patient limited by fatigue;Patient limited by pain Patient left: in chair;with call bell/phone within reach;with chair alarm set;with family/visitor present;with nursing/sitter in room Nurse Communication: Mobility status PT Visit Diagnosis: Unsteadiness on feet (R26.81);Other abnormalities of gait and mobility (R26.89);Muscle weakness (generalized) (M62.81);Difficulty in walking, not elsewhere classified (R26.2);Pain Pain - Right/Left: Right Pain - part of body: Knee     Time: 5176-1607 PT Time Calculation (min) (ACUTE ONLY): 45 min  Charges:                        Rosario Adie, SPT    Rosario Adie 12/12/2017, 10:44 AM

## 2017-12-12 NOTE — Progress Notes (Signed)
   Subjective: 2 Days Post-Op Procedure(s) (LRB): COMPUTER ASSISTED TOTAL KNEE ARTHROPLASTY (Right) Patient reports pain as moderate.   Patient is well, and has had no acute complaints or problems  Patient is complaining of being extremely jerking trembling this morning and was apprehensive about getting up going to the bathroom Patient did very well with physical therapy yesterday.  Was able to make a lap around the nurses test.  Still needs to do stairs prior to being discharged Plan is to go Home after hospital stay. no nausea and no vomiting Patient denies any chest pains or shortness of breath. Objective: Vital signs in last 24 hours: Temp:  [97.5 F (36.4 C)-98 F (36.7 C)] 97.8 F (36.6 C) (07/25 2227) Pulse Rate:  [56-81] 81 (07/25 2227) Resp:  [19] 19 (07/25 2227) BP: (111-151)/(65-75) 151/75 (07/25 2227) SpO2:  [98 %-100 %] 98 % (07/25 2227) well approximated incision Heels are non tender and elevated off the bed using rolled towels Intake/Output from previous day: 07/25 0701 - 07/26 0700 In: 243.3 [I.V.:143.3; IV Piggyback:100] Out: 152 [Urine:2; Drains:150] Intake/Output this shift: No intake/output data recorded.  Recent Labs    12/10/17 0702  HGB 13.2   Recent Labs    12/10/17 0702  WBC 6.3  RBC 4.25  HCT 38.5  PLT 195   No results for input(s): NA, K, CL, CO2, BUN, CREATININE, GLUCOSE, CALCIUM in the last 72 hours. No results for input(s): LABPT, INR in the last 72 hours.  EXAM General - Patient is Alert, Appropriate and Oriented Extremity - Neurologically intact Neurovascular intact Sensation intact distally Intact pulses distally Dorsiflexion/Plantar flexion intact No cellulitis present Compartment soft Dressing - scant drainage Motor Function - intact, moving foot and toes well on exam.    Past Medical History:  Diagnosis Date  . Anemia    in the past  . Arthritis    joints  . Cancer ([place]) 2000   skin  . Cervical back pain with  evidence of disc disease   . Chronic kidney disease   . Dysrhythmia    pvc  . GERD (gastroesophageal reflux disease)   . Headache    migraines in the past  . Hyperlipidemia   . Hypertension   . Hypothyroidism   . Peripheral vascular disease ([place])   . PONV (postoperative nausea and vomiting)   . Subdural hematoma ([place])   . Superficial thrombophlebitis   . Venous (peripheral) insufficiency   . Venous insufficiency     Assessment/Plan: 2 Days Post-Op Procedure(s) (LRB): COMPUTER ASSISTED TOTAL KNEE ARTHROPLASTY (Right) Active Problems:   S/P total knee arthroplasty  Estimated body mass index is 27.46 kg/m as calculated from the following:   Height as of 11/26/17: 5\' 4"  (1.626 m).   Weight as of 11/26/17: 72.6 kg (160 lb). Up with therapy Discharge home with home health  Labs: None DVT Prophylaxis - Lovenox, Foot Pumps and TED hose Weight-Bearing as tolerated to right leg Hemovac discontinued today.  Ends of the drain appeared to be intact. Please wash operative leg, change dressing and apply TED stockings to both legs. Please give patient 2 extra honeycomb dressings to take home. Patient needs a bowel movement prior to being discharged  Zhaniya Swallows R. [place] 12/12/2017, 7:15 AM

## 2017-12-12 NOTE — Progress Notes (Signed)
Patient is being discharged home with family. Reviewed meds, scripts, last dose taken, activity, dressing care, and S&S of infection. Daughter was at bedside during instructions. IV removed with cath intact. Dressing to right knee was changed. Extra dressing sent with patient.

## 2017-12-16 ENCOUNTER — Telehealth: Payer: Self-pay

## 2017-12-16 NOTE — Telephone Encounter (Signed)
EMMI Follow-up: Noted on the report that patient hadn't read her discharge paperwork yet.  I talked with Pamela Conley and she has reviewed her discharge paperwork and is aware of her follow-up appointments.  Everything is going okay.  I let her know there would be another automated call and to let us know if she had any concerns at that time.  No needs noted for today and she thanked me calling.

## 2018-05-08 ENCOUNTER — Other Ambulatory Visit: Payer: Self-pay | Admitting: Physician Assistant

## 2018-05-08 DIAGNOSIS — Z1231 Encounter for screening mammogram for malignant neoplasm of breast: Secondary | ICD-10-CM

## 2018-10-21 ENCOUNTER — Other Ambulatory Visit: Payer: Self-pay

## 2018-10-21 ENCOUNTER — Ambulatory Visit
Admission: RE | Admit: 2018-10-21 | Discharge: 2018-10-21 | Disposition: A | Discharge status: Home / Self Care | Payer: Medicare Other | Source: Ambulatory Visit | Attending: Physician Assistant | Admitting: Physician Assistant

## 2018-10-21 DIAGNOSIS — Z1231 Encounter for screening mammogram for malignant neoplasm of breast: Secondary | ICD-10-CM | POA: Diagnosis not present

## 2019-08-13 ENCOUNTER — Other Ambulatory Visit: Payer: Self-pay | Admitting: Orthopedic Surgery

## 2019-08-13 DIAGNOSIS — M5416 Radiculopathy, lumbar region: Secondary | ICD-10-CM

## 2019-08-13 DIAGNOSIS — M25552 Pain in left hip: Secondary | ICD-10-CM

## 2019-08-27 ENCOUNTER — Ambulatory Visit: Payer: Medicare Other

## 2019-09-16 ENCOUNTER — Other Ambulatory Visit: Payer: Self-pay | Admitting: Physician Assistant

## 2019-09-16 DIAGNOSIS — Z1231 Encounter for screening mammogram for malignant neoplasm of breast: Secondary | ICD-10-CM

## 2019-09-22 ENCOUNTER — Encounter: Payer: Self-pay | Admitting: Ophthalmology

## 2019-09-22 ENCOUNTER — Other Ambulatory Visit: Payer: Self-pay

## 2019-09-27 ENCOUNTER — Other Ambulatory Visit
Admission: RE | Admit: 2019-09-27 | Discharge: 2019-09-27 | Disposition: A | Discharge status: Home / Self Care | Payer: Medicare Other | Source: Ambulatory Visit | Attending: Ophthalmology | Admitting: Ophthalmology

## 2019-09-27 DIAGNOSIS — Z20822 Contact with and (suspected) exposure to covid-19: Secondary | ICD-10-CM | POA: Insufficient documentation

## 2019-09-27 DIAGNOSIS — Z01812 Encounter for preprocedural laboratory examination: Secondary | ICD-10-CM | POA: Insufficient documentation

## 2019-09-27 LAB — SARS CORONAVIRUS 2 (TAT 6-24 HRS): SARS Coronavirus 2: NEGATIVE

## 2019-09-28 NOTE — Discharge Instructions (Signed)

## 2019-09-29 ENCOUNTER — Encounter
Admission: RE | Disposition: A | Discharge status: Home / Self Care | Payer: Self-pay | Source: Home / Self Care | Attending: Ophthalmology

## 2019-09-29 ENCOUNTER — Ambulatory Visit: Payer: Medicare Other | Admitting: Anesthesiology

## 2019-09-29 ENCOUNTER — Encounter: Payer: Self-pay | Admitting: Ophthalmology

## 2019-09-29 ENCOUNTER — Ambulatory Visit
Admission: RE | Admit: 2019-09-29 | Discharge: 2019-09-29 | Disposition: A | Discharge status: Home / Self Care | Payer: Medicare Other | Attending: Ophthalmology | Admitting: Ophthalmology

## 2019-09-29 ENCOUNTER — Other Ambulatory Visit: Payer: Self-pay

## 2019-09-29 DIAGNOSIS — Z882 Allergy status to sulfonamides status: Secondary | ICD-10-CM | POA: Diagnosis not present

## 2019-09-29 DIAGNOSIS — Z96651 Presence of right artificial knee joint: Secondary | ICD-10-CM | POA: Insufficient documentation

## 2019-09-29 DIAGNOSIS — K219 Gastro-esophageal reflux disease without esophagitis: Secondary | ICD-10-CM | POA: Insufficient documentation

## 2019-09-29 DIAGNOSIS — Z888 Allergy status to other drugs, medicaments and biological substances status: Secondary | ICD-10-CM | POA: Diagnosis not present

## 2019-09-29 DIAGNOSIS — I10 Essential (primary) hypertension: Secondary | ICD-10-CM | POA: Diagnosis not present

## 2019-09-29 DIAGNOSIS — I499 Cardiac arrhythmia, unspecified: Secondary | ICD-10-CM | POA: Insufficient documentation

## 2019-09-29 DIAGNOSIS — M1990 Unspecified osteoarthritis, unspecified site: Secondary | ICD-10-CM | POA: Insufficient documentation

## 2019-09-29 DIAGNOSIS — I739 Peripheral vascular disease, unspecified: Secondary | ICD-10-CM | POA: Insufficient documentation

## 2019-09-29 DIAGNOSIS — Z885 Allergy status to narcotic agent status: Secondary | ICD-10-CM | POA: Insufficient documentation

## 2019-09-29 DIAGNOSIS — M7989 Other specified soft tissue disorders: Secondary | ICD-10-CM | POA: Diagnosis not present

## 2019-09-29 DIAGNOSIS — H2511 Age-related nuclear cataract, right eye: Secondary | ICD-10-CM | POA: Insufficient documentation

## 2019-09-29 DIAGNOSIS — E039 Hypothyroidism, unspecified: Secondary | ICD-10-CM | POA: Insufficient documentation

## 2019-09-29 HISTORY — PX: CATARACT EXTRACTION W/PHACO: SHX586

## 2019-09-29 HISTORY — DX: Carpal tunnel syndrome, right upper limb: G56.01

## 2019-09-29 SURGERY — PHACOEMULSIFICATION, CATARACT, WITH IOL INSERTION
Anesthesia: Monitor Anesthesia Care | Site: Eye | Laterality: Right

## 2019-09-29 MED ORDER — TETRACAINE HCL 0.5 % OP SOLN
1.0000 [drp] | OPHTHALMIC | Status: DC | PRN
  Administered 2019-09-29 (×3): 1 [drp] via OPHTHALMIC

## 2019-09-29 MED ORDER — ACETAMINOPHEN 160 MG/5ML PO SOLN: 325.0000 mg | ORAL | Status: DC | PRN

## 2019-09-29 MED ORDER — EPINEPHRINE PF 1 MG/ML IJ SOLN
INTRAOCULAR | Status: DC | PRN
  Administered 2019-09-29: 48 mL via OPHTHALMIC

## 2019-09-29 MED ORDER — NA HYALUR & NA CHOND-NA HYALUR 0.4-0.35 ML IO KIT
PACK | INTRAOCULAR | Status: DC | PRN
  Administered 2019-09-29: 1 mL via INTRAOCULAR

## 2019-09-29 MED ORDER — LIDOCAINE HCL (PF) 2 % IJ SOLN
INTRAOCULAR | Status: DC | PRN
  Administered 2019-09-29: 1 mL

## 2019-09-29 MED ORDER — ARMC OPHTHALMIC DILATING DROPS
1.0000 "application " | OPHTHALMIC | Status: DC | PRN
  Administered 2019-09-29 (×3): 1 via OPHTHALMIC

## 2019-09-29 MED ORDER — CEFUROXIME OPHTHALMIC INJECTION 1 MG/0.1 ML
INJECTION | OPHTHALMIC | Status: DC | PRN
  Administered 2019-09-29: 0.1 mL via INTRACAMERAL

## 2019-09-29 MED ORDER — MOXIFLOXACIN HCL 0.5 % OP SOLN
1.0000 [drp] | OPHTHALMIC | Status: DC | PRN
  Administered 2019-09-29 (×3): 1 [drp] via OPHTHALMIC

## 2019-09-29 MED ORDER — BRIMONIDINE TARTRATE-TIMOLOL 0.2-0.5 % OP SOLN
OPHTHALMIC | Status: DC | PRN
  Administered 2019-09-29: 1 [drp] via OPHTHALMIC

## 2019-09-29 MED ORDER — ACETAMINOPHEN 325 MG PO TABS: 325.0000 mg | ORAL_TABLET | ORAL | Status: DC | PRN

## 2019-09-29 MED ORDER — MIDAZOLAM HCL 2 MG/2ML IJ SOLN
INTRAMUSCULAR | Status: DC | PRN
  Administered 2019-09-29: 2 mg via INTRAVENOUS

## 2019-09-29 MED ORDER — FENTANYL CITRATE (PF) 100 MCG/2ML IJ SOLN
INTRAMUSCULAR | Status: DC | PRN
  Administered 2019-09-29 (×2): 50 ug via INTRAVENOUS

## 2019-09-29 SURGICAL SUPPLY — 23 items
CANNULA ANT/CHMB 27G (MISCELLANEOUS) ×1 IMPLANT
CANNULA ANT/CHMB 27GA (MISCELLANEOUS) ×2 IMPLANT
GLOVE SURG LX 7.5 STRW (GLOVE) ×1
GLOVE SURG LX STRL 7.5 STRW (GLOVE) ×1 IMPLANT
GLOVE SURG TRIUMPH 8.0 PF LTX (GLOVE) ×2 IMPLANT
GOWN STRL REUS W/ TWL LRG LVL3 (GOWN DISPOSABLE) ×2 IMPLANT
GOWN STRL REUS W/TWL LRG LVL3 (GOWN DISPOSABLE) ×2
LENS IOL DIOP 17.0 (Intraocular Lens) ×2 IMPLANT
LENS IOL TECNIS MONO 17.0 (Intraocular Lens) IMPLANT
MARKER SKIN DUAL TIP RULER LAB (MISCELLANEOUS) ×2 IMPLANT
NDL CAPSULORHEX 25GA (NEEDLE) ×1 IMPLANT
NDL FILTER BLUNT 18X1 1/2 (NEEDLE) ×2 IMPLANT
NEEDLE CAPSULORHEX 25GA (NEEDLE) ×2 IMPLANT
NEEDLE FILTER BLUNT 18X 1/2SAF (NEEDLE) ×2
NEEDLE FILTER BLUNT 18X1 1/2 (NEEDLE) ×2 IMPLANT
PACK CATARACT BRASINGTON (MISCELLANEOUS) ×2 IMPLANT
PACK EYE AFTER SURG (MISCELLANEOUS) ×2 IMPLANT
PACK OPTHALMIC (MISCELLANEOUS) ×2 IMPLANT
SOLUTION OPHTHALMIC SALT (MISCELLANEOUS) ×2 IMPLANT
SYR 3ML LL SCALE MARK (SYRINGE) ×4 IMPLANT
SYR TB 1ML LUER SLIP (SYRINGE) ×2 IMPLANT
WATER STERILE IRR 250ML POUR (IV SOLUTION) ×2 IMPLANT
WIPE NON LINTING 3.25X3.25 (MISCELLANEOUS) ×2 IMPLANT

## 2019-09-29 NOTE — Op Note (Signed)
LOCATION:  [place]   PREOPERATIVE DIAGNOSIS:    Nuclear sclerotic cataract right eye. H25.11   POSTOPERATIVE DIAGNOSIS:  Nuclear sclerotic cataract right eye.     PROCEDURE:  Phacoemusification with posterior chamber intraocular lens placement of the right eye   ULTRASOUND TIME: Procedure(s): CATARACT EXTRACTION PHACO AND INTRAOCULAR LENS PLACEMENT (IOC) RIGHT 3.43 00:32.4 10.6% (Right)  LENS:   Implant Name Type Inv. Item Serial No. Manufacturer Lot No. LRB No. Used Action  LENS IOL DIOP 17.0 - [id number] Intraocular Lens LENS IOL DIOP 17.0 [id number] AMO  Right 1 Implanted         SURGEON:  Wyonia Hough, MD   ANESTHESIA:  Topical with tetracaine drops and 2% Xylocaine jelly, augmented with 1% preservative-free intracameral lidocaine.    COMPLICATIONS:  None.   DESCRIPTION OF PROCEDURE:  The patient was identified in the holding room and transported to the operating room and placed in the supine position under the operating microscope.  The right eye was identified as the operative eye and it was prepped and draped in the usual sterile ophthalmic fashion.   A 1 millimeter clear-corneal paracentesis was made at the 12:00 position.  0.5 ml of preservative-free 1% lidocaine was injected into the anterior chamber. The anterior chamber was filled with Viscoat viscoelastic.  A 2.4 millimeter keratome was used to make a near-clear corneal incision at the 9:00 position.  A curvilinear capsulorrhexis was made with a cystotome and capsulorrhexis forceps.  Balanced salt solution was used to hydrodissect and hydrodelineate the nucleus.   Phacoemulsification was then used in stop and chop fashion to remove the lens nucleus and epinucleus.  The remaining cortex was then removed using the irrigation and aspiration handpiece. Provisc was then placed into the capsular bag to distend it for lens placement.  A lens was then injected into the capsular bag.  The remaining  viscoelastic was aspirated.   Wounds were hydrated with balanced salt solution.  The anterior chamber was inflated to a physiologic pressure with balanced salt solution.  No wound leaks were noted. Cefuroxime 0.1 ml of a 10mg /ml solution was injected into the anterior chamber for a dose of 1 mg of intracameral antibiotic at the completion of the case.   Timolol and Brimonidine drops were applied to the eye.  The patient was taken to the recovery room in stable condition without complications of anesthesia or surgery.   Maebry Obrien 09/29/2019, 10:04 AM

## 2019-09-29 NOTE — Anesthesia Postprocedure Evaluation (Signed)
Anesthesia Post Note  Patient: Pamela Conley  Procedure(s) Performed: CATARACT EXTRACTION PHACO AND INTRAOCULAR LENS PLACEMENT (IOC) RIGHT 3.43 00:32.4 10.6% (Right Eye)     Patient location during evaluation: PACU Anesthesia Type: MAC Level of consciousness: awake and alert Pain management: pain level controlled Vital Signs Assessment: post-procedure vital signs reviewed and stable Respiratory status: spontaneous breathing, nonlabored ventilation, respiratory function stable and patient connected to nasal cannula oxygen Cardiovascular status: stable and blood pressure returned to baseline Postop Assessment: no apparent nausea or vomiting Anesthetic complications: no    Trecia Rogers

## 2019-09-29 NOTE — Anesthesia Preprocedure Evaluation (Signed)
Anesthesia Evaluation  Patient identified by MRN, date of birth, ID band Patient awake    Reviewed: Allergy & Precautions, H&P , NPO status , Patient's Chart, lab work & pertinent test results, reviewed documented beta blocker date and time   History of Anesthesia Complications (+) PONV and history of anesthetic complications  Airway Mallampati: II  TM Distance: >3 FB Neck ROM: full    Dental no notable dental hx.    Pulmonary neg pulmonary ROS,    Pulmonary exam normal breath sounds clear to auscultation       Cardiovascular Exercise Tolerance: Good hypertension, + Peripheral Vascular Disease  Normal cardiovascular exam+ dysrhythmias  Rhythm:regular Rate:Normal     Neuro/Psych negative neurological ROS  negative psych ROS   GI/Hepatic Neg liver ROS, GERD  Controlled,  Endo/Other  Hypothyroidism   Renal/GU CRFRenal disease  negative genitourinary   Musculoskeletal  (+) Arthritis ,   Abdominal   Peds  Hematology negative hematology ROS (+)   Anesthesia Other Findings   Reproductive/Obstetrics negative OB ROS                             Anesthesia Physical Anesthesia Plan  ASA: II  Anesthesia Plan: MAC   Post-op Pain Management:    Induction:   PONV Risk Score and Plan:   Airway Management Planned:   Additional Equipment:   Intra-op Plan:   Post-operative Plan:   Informed Consent: I have reviewed the patients History and Physical, chart, labs and discussed the procedure including the risks, benefits and alternatives for the proposed anesthesia with the patient or authorized representative who has indicated his/her understanding and acceptance.     Dental Advisory Given  Plan Discussed with: CRNA and Anesthesiologist  Anesthesia Plan Comments:         Anesthesia Quick Evaluation

## 2019-09-29 NOTE — H&P (Signed)

## 2019-09-29 NOTE — Anesthesia Procedure Notes (Signed)
Procedure Name: MAC Date/Time: 09/29/2019 9:49 AM Performed by: Jeannene Patella, CRNA Pre-anesthesia Checklist: Patient identified, Emergency Drugs available, Suction available, Patient being monitored and Timeout performed Patient Re-evaluated:Patient Re-evaluated prior to induction Oxygen Delivery Method: Nasal cannula

## 2019-09-29 NOTE — Transfer of Care (Signed)
Immediate Anesthesia Transfer of Care Note  Patient: Pamela Conley  Procedure(s) Performed: CATARACT EXTRACTION PHACO AND INTRAOCULAR LENS PLACEMENT ([facility]) RIGHT 3.43 00:32.4 10.6% (Right Eye)  Patient Location: PACU  Anesthesia Type: MAC  Level of Consciousness: awake, alert  and patient cooperative  Airway and Oxygen Therapy: Patient Spontanous Breathing and Patient connected to supplemental oxygen  Post-op Assessment: Post-op Vital signs reviewed, Patient's Cardiovascular Status Stable, Respiratory Function Stable, Patent Airway and No signs of Nausea or vomiting  Post-op Vital Signs: Reviewed and stable  Complications: No apparent anesthesia complications

## 2019-09-30 ENCOUNTER — Encounter: Payer: Self-pay | Admitting: *Deleted

## 2019-10-19 ENCOUNTER — Encounter: Payer: Self-pay | Admitting: Ophthalmology

## 2019-10-19 ENCOUNTER — Other Ambulatory Visit: Payer: Self-pay

## 2019-10-25 ENCOUNTER — Other Ambulatory Visit: Payer: Self-pay

## 2019-10-25 ENCOUNTER — Other Ambulatory Visit
Admission: RE | Admit: 2019-10-25 | Discharge: 2019-10-25 | Disposition: A | Discharge status: Home / Self Care | Payer: Medicare Other | Source: Ambulatory Visit | Attending: Ophthalmology | Admitting: Ophthalmology

## 2019-10-25 DIAGNOSIS — Z20822 Contact with and (suspected) exposure to covid-19: Secondary | ICD-10-CM | POA: Diagnosis not present

## 2019-10-25 DIAGNOSIS — Z01812 Encounter for preprocedural laboratory examination: Secondary | ICD-10-CM | POA: Insufficient documentation

## 2019-10-25 LAB — SARS CORONAVIRUS 2 (TAT 6-24 HRS): SARS Coronavirus 2: NEGATIVE

## 2019-10-25 NOTE — Discharge Instructions (Signed)

## 2019-10-27 ENCOUNTER — Encounter: Payer: Self-pay | Admitting: Ophthalmology

## 2019-10-27 ENCOUNTER — Ambulatory Visit: Payer: Medicare Other | Admitting: Anesthesiology

## 2019-10-27 ENCOUNTER — Ambulatory Visit
Admission: RE | Admit: 2019-10-27 | Discharge: 2019-10-27 | Disposition: A | Discharge status: Home / Self Care | Payer: Medicare Other | Attending: Ophthalmology | Admitting: Ophthalmology

## 2019-10-27 ENCOUNTER — Encounter
Admission: RE | Disposition: A | Discharge status: Home / Self Care | Payer: Self-pay | Source: Home / Self Care | Attending: Ophthalmology

## 2019-10-27 ENCOUNTER — Other Ambulatory Visit: Payer: Self-pay

## 2019-10-27 DIAGNOSIS — Z7982 Long term (current) use of aspirin: Secondary | ICD-10-CM | POA: Diagnosis not present

## 2019-10-27 DIAGNOSIS — K219 Gastro-esophageal reflux disease without esophagitis: Secondary | ICD-10-CM | POA: Insufficient documentation

## 2019-10-27 DIAGNOSIS — I10 Essential (primary) hypertension: Secondary | ICD-10-CM | POA: Diagnosis not present

## 2019-10-27 DIAGNOSIS — H2512 Age-related nuclear cataract, left eye: Secondary | ICD-10-CM | POA: Insufficient documentation

## 2019-10-27 DIAGNOSIS — Z96651 Presence of right artificial knee joint: Secondary | ICD-10-CM | POA: Diagnosis not present

## 2019-10-27 DIAGNOSIS — E039 Hypothyroidism, unspecified: Secondary | ICD-10-CM | POA: Diagnosis not present

## 2019-10-27 DIAGNOSIS — Z79899 Other long term (current) drug therapy: Secondary | ICD-10-CM | POA: Insufficient documentation

## 2019-10-27 HISTORY — PX: CATARACT EXTRACTION W/PHACO: SHX586

## 2019-10-27 SURGERY — PHACOEMULSIFICATION, CATARACT, WITH IOL INSERTION
Anesthesia: Monitor Anesthesia Care | Site: Eye | Laterality: Left

## 2019-10-27 MED ORDER — CEFUROXIME OPHTHALMIC INJECTION 1 MG/0.1 ML
INJECTION | OPHTHALMIC | Status: DC | PRN
  Administered 2019-10-27: 0.1 mL via INTRACAMERAL

## 2019-10-27 MED ORDER — MIDAZOLAM HCL 2 MG/2ML IJ SOLN
INTRAMUSCULAR | Status: DC | PRN
  Administered 2019-10-27: 2 mg via INTRAVENOUS

## 2019-10-27 MED ORDER — LIDOCAINE HCL (PF) 2 % IJ SOLN
INTRAOCULAR | Status: DC | PRN
  Administered 2019-10-27: 1 mL

## 2019-10-27 MED ORDER — ARMC OPHTHALMIC DILATING DROPS
1.0000 "application " | OPHTHALMIC | Status: DC | PRN
  Administered 2019-10-27 (×3): 1 via OPHTHALMIC

## 2019-10-27 MED ORDER — FENTANYL CITRATE (PF) 100 MCG/2ML IJ SOLN
INTRAMUSCULAR | Status: DC | PRN
  Administered 2019-10-27 (×2): 50 ug via INTRAVENOUS

## 2019-10-27 MED ORDER — TETRACAINE HCL 0.5 % OP SOLN
1.0000 [drp] | OPHTHALMIC | Status: DC | PRN
  Administered 2019-10-27 (×3): 1 [drp] via OPHTHALMIC

## 2019-10-27 MED ORDER — MOXIFLOXACIN HCL 0.5 % OP SOLN
1.0000 [drp] | OPHTHALMIC | Status: DC | PRN
  Administered 2019-10-27 (×3): 1 [drp] via OPHTHALMIC

## 2019-10-27 MED ORDER — EPINEPHRINE PF 1 MG/ML IJ SOLN
INTRAOCULAR | Status: DC | PRN
  Administered 2019-10-27: 50 mL via OPHTHALMIC

## 2019-10-27 MED ORDER — BRIMONIDINE TARTRATE-TIMOLOL 0.2-0.5 % OP SOLN
OPHTHALMIC | Status: DC | PRN
  Administered 2019-10-27: 1 [drp] via OPHTHALMIC

## 2019-10-27 MED ORDER — NA HYALUR & NA CHOND-NA HYALUR 0.4-0.35 ML IO KIT
PACK | INTRAOCULAR | Status: DC | PRN
  Administered 2019-10-27: 1 mL via INTRAOCULAR

## 2019-10-27 SURGICAL SUPPLY — 29 items
CANNULA ANT/CHMB 27G (MISCELLANEOUS) ×1 IMPLANT
CANNULA ANT/CHMB 27GA (MISCELLANEOUS) ×3 IMPLANT
GLOVE SURG LX 7.5 STRW (GLOVE) ×4
GLOVE SURG LX STRL 7.5 STRW (GLOVE) ×1 IMPLANT
GLOVE SURG TRIUMPH 8.0 PF LTX (GLOVE) ×3 IMPLANT
GOWN STRL REUS W/ TWL LRG LVL3 (GOWN DISPOSABLE) ×2 IMPLANT
GOWN STRL REUS W/TWL LRG LVL3 (GOWN DISPOSABLE) ×4
LENS IOL TECNIS 17.5 (Intraocular Lens) ×3 IMPLANT
LENS IOL TECNIS MONO 1P 17.5 (Intraocular Lens) IMPLANT
MARKER SKIN DUAL TIP RULER LAB (MISCELLANEOUS) ×3 IMPLANT
NDL CAPSULORHEX 25GA (NEEDLE) ×1 IMPLANT
NDL FILTER BLUNT 18X1 1/2 (NEEDLE) ×2 IMPLANT
NDL RETROBULBAR .5 NSTRL (NEEDLE) IMPLANT
NEEDLE CAPSULORHEX 25GA (NEEDLE) ×3 IMPLANT
NEEDLE FILTER BLUNT 18X 1/2SAF (NEEDLE) ×4
NEEDLE FILTER BLUNT 18X1 1/2 (NEEDLE) ×2 IMPLANT
PACK CATARACT BRASINGTON (MISCELLANEOUS) ×3 IMPLANT
PACK EYE AFTER SURG (MISCELLANEOUS) ×3 IMPLANT
PACK OPTHALMIC (MISCELLANEOUS) ×3 IMPLANT
RING MALYGIN 7.0 (MISCELLANEOUS) IMPLANT
SOLUTION OPHTHALMIC SALT (MISCELLANEOUS) ×3 IMPLANT
SUT ETHILON 10-0 CS-B-6CS-B-6 (SUTURE)
SUT VICRYL  9 0 (SUTURE)
SUT VICRYL 9 0 (SUTURE) IMPLANT
SUTURE EHLN 10-0 CS-B-6CS-B-6 (SUTURE) IMPLANT
SYR 3ML LL SCALE MARK (SYRINGE) ×6 IMPLANT
SYR TB 1ML LUER SLIP (SYRINGE) ×3 IMPLANT
WATER STERILE IRR 250ML POUR (IV SOLUTION) ×3 IMPLANT
WIPE NON LINTING 3.25X3.25 (MISCELLANEOUS) ×3 IMPLANT

## 2019-10-27 NOTE — Transfer of Care (Signed)
Immediate Anesthesia Transfer of Care Note  Patient: Pamela Conley  Procedure(s) Performed: CATARACT EXTRACTION PHACO AND INTRAOCULAR LENS PLACEMENT ([place]) LEFT (Left Eye)  Patient Location: PACU  Anesthesia Type: MAC  Level of Consciousness: awake, alert  and patient cooperative  Airway and Oxygen Therapy: Patient Spontanous Breathing and Patient connected to supplemental oxygen  Post-op Assessment: Post-op Vital signs reviewed, Patient's Cardiovascular Status Stable, Respiratory Function Stable, Patent Airway and No signs of Nausea or vomiting  Post-op Vital Signs: Reviewed and stable  Complications: No apparent anesthesia complications

## 2019-10-27 NOTE — Anesthesia Preprocedure Evaluation (Signed)
Anesthesia Evaluation  Patient identified by MRN, date of birth, ID band Patient awake    History of Anesthesia Complications (+) PONV and history of anesthetic complications  Airway Mallampati: II  TM Distance: >3 FB Neck ROM: Full    Dental no notable dental hx.    Pulmonary neg pulmonary ROS,    Pulmonary exam normal        Cardiovascular hypertension, Pt. on medications Normal cardiovascular exam  2017 Echo INTERPRETATION NORMAL LEFT VENTRICULAR SYSTOLIC FUNCTION NORMAL RIGHT VENTRICULAR SYSTOLIC FUNCTION MODERATE VALVULAR REGURGITATION (See above) NO VALVULAR STENOSIS Size: MILDLY ENLARGED Size: MILDLY ENLARGED Mitral: MODERATE MR Tricuspid: MILD TR Closest EF: 45% (Estimated) Aortic: MILD AR   Neuro/Psych    GI/Hepatic GERD  ,  Endo/Other  Hypothyroidism   Renal/GU      Musculoskeletal   Abdominal   Peds  Hematology   Anesthesia Other Findings   Reproductive/Obstetrics                            Anesthesia Physical Anesthesia Plan  ASA: II  Anesthesia Plan: MAC   Post-op Pain Management:    Induction: Intravenous  PONV Risk Score and Plan: 3 and Midazolam, TIVA and Treatment may vary due to age or medical condition  Airway Management Planned: Nasal Cannula and Natural Airway  Additional Equipment: None  Intra-op Plan:   Post-operative Plan:   Informed Consent: I have reviewed the patients History and Physical, chart, labs and discussed the procedure including the risks, benefits and alternatives for the proposed anesthesia with the patient or authorized representative who has indicated his/her understanding and acceptance.       Plan Discussed with: CRNA  Anesthesia Plan Comments:         Anesthesia Quick Evaluation

## 2019-10-27 NOTE — Op Note (Signed)
OPERATIVE NOTE  Pamela Conley [id number] 10/27/2019   PREOPERATIVE DIAGNOSIS:  Nuclear sclerotic cataract left eye. H25.12   POSTOPERATIVE DIAGNOSIS:    Nuclear sclerotic cataract left eye.     PROCEDURE:  Phacoemusification with posterior chamber intraocular lens placement of the left eye  Ultrasound time: Procedure(s) with comments: CATARACT EXTRACTION PHACO AND INTRAOCULAR LENS PLACEMENT ([facility]) LEFT (Left) - 3.25 0:42.3 7.6%  LENS:   Implant Name Type Inv. Item Serial No. Manufacturer Lot No. LRB No. Used Action  LENS IOL TECNIS 17.5 - [id number] Intraocular Lens LENS IOL TECNIS 17.5 [id number] AMO  Left 1 Implanted      SURGEON:  Wyonia Hough, MD   ANESTHESIA:  Topical with tetracaine drops and 2% Xylocaine jelly, augmented with 1% preservative-free intracameral lidocaine.    COMPLICATIONS:  None.   DESCRIPTION OF PROCEDURE:  The patient was identified in the holding room and transported to the operating room and placed in the supine position under the operating microscope.  The left eye was identified as the operative eye and it was prepped and draped in the usual sterile ophthalmic fashion.   A 1 millimeter clear-corneal paracentesis was made at the 1:30 position.  0.5 ml of preservative-free 1% lidocaine was injected into the anterior chamber.  The anterior chamber was filled with Viscoat viscoelastic.  A 2.4 millimeter keratome was used to make a near-clear corneal incision at the 10:30 position.  .  A curvilinear capsulorrhexis was made with a cystotome and capsulorrhexis forceps.  Balanced salt solution was used to hydrodissect and hydrodelineate the nucleus.   Phacoemulsification was then used in stop and chop fashion to remove the lens nucleus and epinucleus.  The remaining cortex was then removed using the irrigation and aspiration handpiece. Provisc was then placed into the capsular bag to distend it for lens placement.  A lens was then injected into the  capsular bag.  The remaining viscoelastic was aspirated.   Wounds were hydrated with balanced salt solution.  The anterior chamber was inflated to a physiologic pressure with balanced salt solution.  No wound leaks were noted. Cefuroxime 0.1 ml of a 10mg /ml solution was injected into the anterior chamber for a dose of 1 mg of intracameral antibiotic at the completion of the case.   Timolol and Brimonidine drops were applied to the eye.  The patient was taken to the recovery room in stable condition without complications of anesthesia or surgery.  [name] 10/27/2019, 11:55 AM

## 2019-10-27 NOTE — H&P (Signed)

## 2019-10-27 NOTE — Anesthesia Postprocedure Evaluation (Signed)
Anesthesia Post Note  Patient: Pamela Conley  Procedure(s) Performed: CATARACT EXTRACTION PHACO AND INTRAOCULAR LENS PLACEMENT ([place]) LEFT (Left Eye)     Patient location during evaluation: PACU Anesthesia Type: MAC Level of consciousness: awake and alert Pain management: pain level controlled Vital Signs Assessment: post-procedure vital signs reviewed and stable Respiratory status: spontaneous breathing Cardiovascular status: blood pressure returned to baseline Postop Assessment: no apparent nausea or vomiting, adequate PO intake and no headache Anesthetic complications: no    Adele Barthel Mareli Antunes

## 2019-10-27 NOTE — Anesthesia Procedure Notes (Signed)
Procedure Name: MAC Date/Time: 10/27/2019 11:40 AM Performed by: Jeannene Patella, CRNA Pre-anesthesia Checklist: Patient identified, Emergency Drugs available, Suction available, Patient being monitored and Timeout performed Patient Re-evaluated:Patient Re-evaluated prior to induction Oxygen Delivery Method: Nasal cannula

## 2019-10-28 ENCOUNTER — Encounter: Payer: Self-pay | Admitting: Ophthalmology

## 2019-11-09 ENCOUNTER — Ambulatory Visit
Admission: RE | Admit: 2019-11-09 | Discharge: 2019-11-09 | Disposition: A | Discharge status: Home / Self Care | Payer: Medicare Other | Source: Ambulatory Visit | Attending: Physician Assistant | Admitting: Physician Assistant

## 2019-11-09 DIAGNOSIS — Z1231 Encounter for screening mammogram for malignant neoplasm of breast: Secondary | ICD-10-CM | POA: Insufficient documentation

## 2020-01-05 DIAGNOSIS — I38 Endocarditis, valve unspecified: Secondary | ICD-10-CM | POA: Insufficient documentation

## 2020-06-01 IMAGING — DX DG KNEE 1-2V PORT*R*
2 series · 2 of 2 positions shown · non-contrast
Comparison: None.

CLINICAL DATA: Total knee replacement

EXAM:
PORTABLE RIGHT KNEE - 1-2 VIEW

[knee ap]
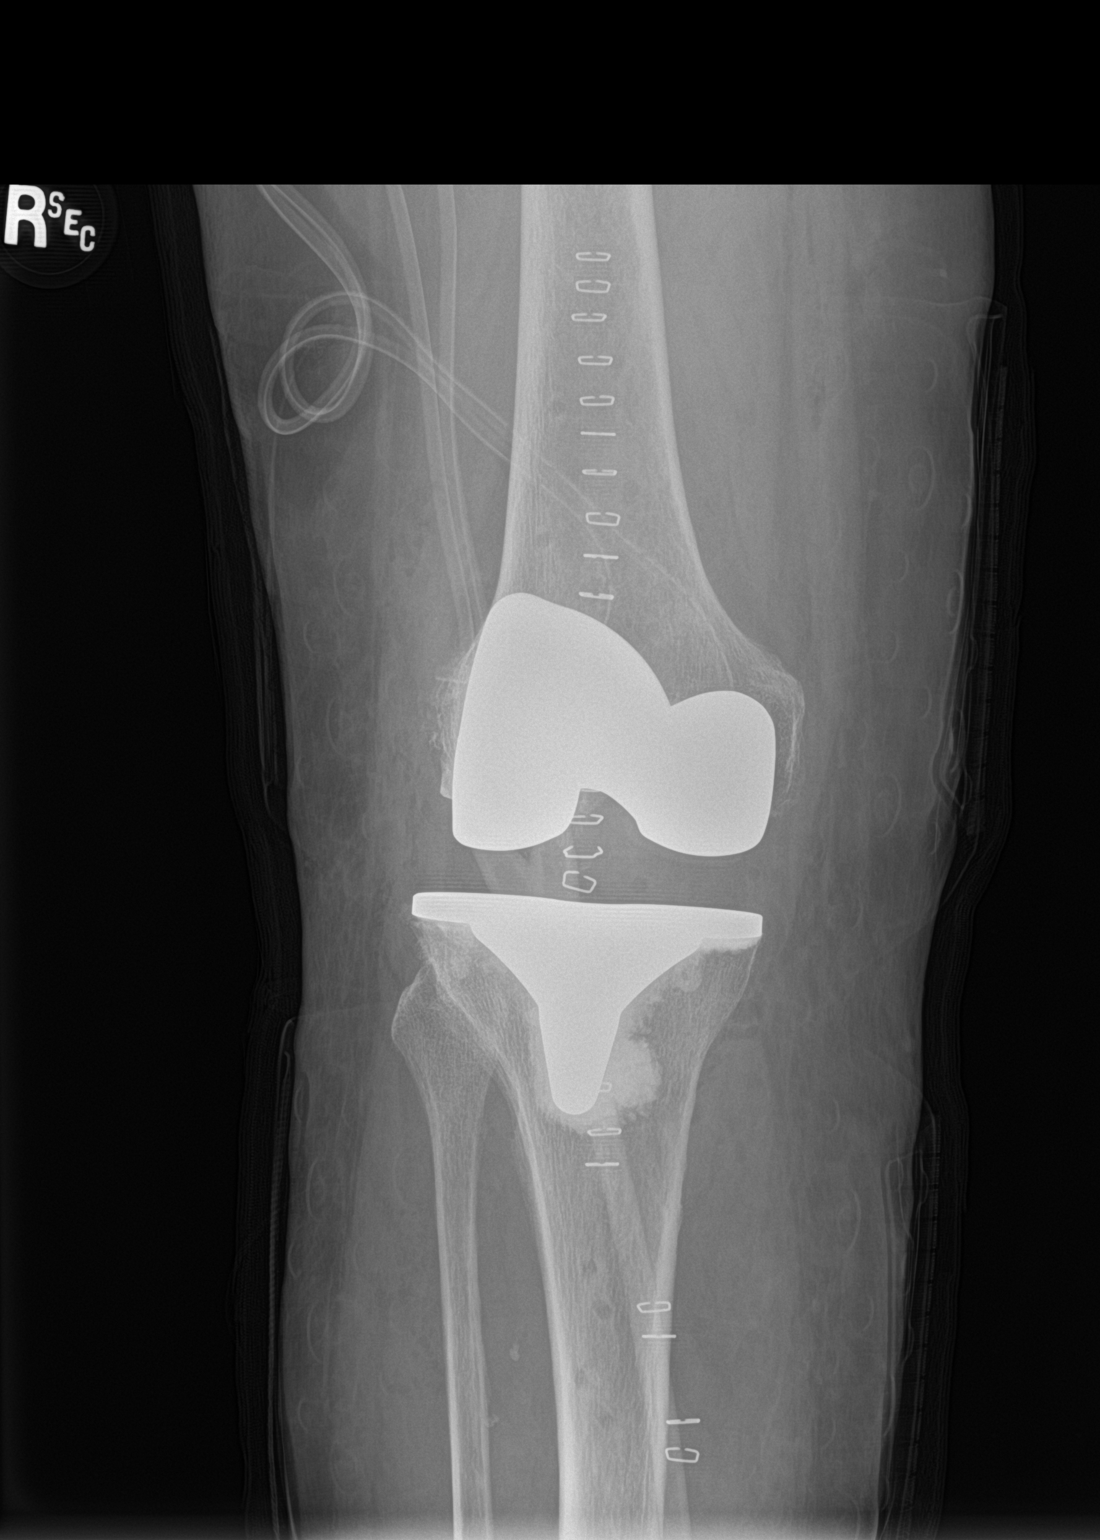

[knee lat]
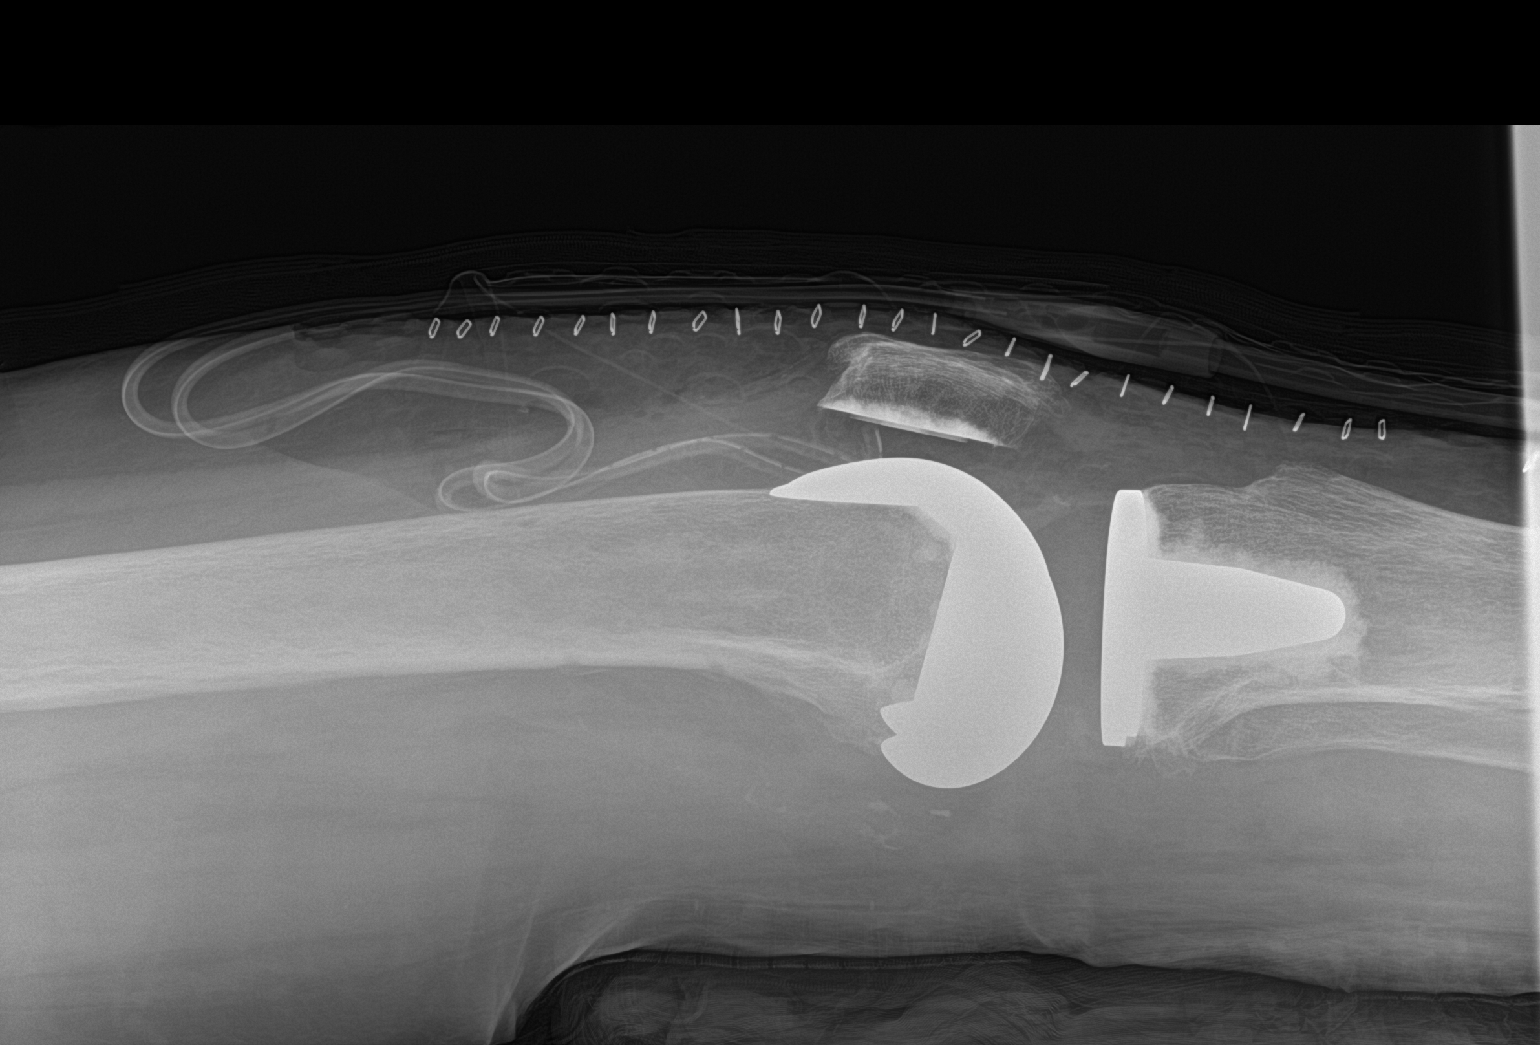

[2 of 2 positions shown; findings below may reference images not displayed]

FINDINGS: Changes of right knee replacement. No hardware or bony complicating
feature. Soft tissue drains in place.
IMPRESSION: Right knee replacement.  No visible complicating feature.

## 2020-09-29 ENCOUNTER — Other Ambulatory Visit: Payer: Self-pay | Admitting: Physician Assistant

## 2020-09-29 DIAGNOSIS — Z1231 Encounter for screening mammogram for malignant neoplasm of breast: Secondary | ICD-10-CM

## 2020-11-09 ENCOUNTER — Ambulatory Visit
Admission: RE | Admit: 2020-11-09 | Discharge: 2020-11-09 | Disposition: A | Discharge status: Home / Self Care | Payer: Medicare Other | Source: Ambulatory Visit | Attending: Physician Assistant | Admitting: Physician Assistant

## 2020-11-09 ENCOUNTER — Other Ambulatory Visit: Payer: Self-pay

## 2020-11-09 DIAGNOSIS — Z1231 Encounter for screening mammogram for malignant neoplasm of breast: Secondary | ICD-10-CM | POA: Diagnosis not present

## 2021-06-10 DIAGNOSIS — G5603 Carpal tunnel syndrome, bilateral upper limbs: Secondary | ICD-10-CM | POA: Insufficient documentation

## 2021-06-24 NOTE — Discharge Instructions (Addendum)
Instructions after Hand / Wrist Surgery   James P. Hooten, Jr., M.D.  Dept. of Orthopaedics & Sports Medicine  [facility]  [address]  [place], [place]  [address]   Phone: [phone]   Fax: [phone]   DIET: Drink plenty of non-alcoholic fluids & begin a light diet. Resume your normal diet the day after surgery.  ACTIVITY:  Keep the hand elevated above the level of the elbow. Begin gently moving the fingers on a regular basis to avoid stiffness. Avoid any heavy lifting, pushing, or pulling with the operative hand. Do not drive or operate any equipment until instructed.  WOUND CARE:  Keep the splint/bandage clean and dry.  The splint and stitches will be removed in the office. Continue to use the ice packs periodically to reduce pain and swelling. You may bathe or shower after the stitches are removed at the first office visit following surgery.  MEDICATIONS: You may resume your regular medications. Please take the pain medication as prescribed. Do not take pain medication on an empty stomach. Do not drive or drink alcoholic beverages when taking pain medications.  CALL THE OFFICE FOR: Temperature above 101 degrees Excessive bleeding or drainage on the dressing. Excessive swelling, coldness, or paleness of the fingers. Persistent nausea and vomiting.  FOLLOW-UP:  You should have an appointment to return to the office in 7-10 days after surgery.   REMEMBER: R.I.C.E. = Rest, Ice, Compression, Elevation !      [facility] Department Directory         [email or web address]       [email or web address]          Cardiology  Appointments: [place] - [phone] [place] - [phone]  Endocrinology  Appointments: [place] - [phone] [place] - [phone]  Gastroenterology  Appointments: [place] - [phone] [place] - [phone]        General Surgery   Appointments: [place] -  [phone]  Internal Medicine/Family Medicine  Appointments: [place] - [phone] [place] - [phone] [place] - [phone]  Metabolic and Weigh Loss Surgery  Appointments: [place] - [phone]        Neurology  Appointments: [place] - [phone] [place] - [phone]  Neurosurgery  Appointments: [place] - [phone]  Obstetrics & Gynecology  Appointments: [place] - [phone] [place] - [phone]        Pediatrics  Appointments: [place] - [phone] [place] - [phone]  Physiatry  Appointments: [place] -[phone]  Physical Therapy  Appointments: [place] - [phone] [place] - [phone]        Podiatry  Appointments: [place] - [phone] [place] - [phone]  Pulmonology  Appointments: [place] - [phone]  Rheumatology  Appointments: [place] - [phone]        [place] Location: [facility]  [address] [place], [place]  [address]  [place] Location: [facility] [address] [place], [place]  [address]  [place] Location: [facility] [address] [place], [place]  [address]            AMBULATORY SURGERY  DISCHARGE INSTRUCTIONS   The drugs that you were given will stay in your system until tomorrow so for the next 24 hours you should not:  Drive an automobile Make any legal decisions Drink any alcoholic beverage   You may resume regular meals tomorrow.  Today it is better to start with liquids and gradually work up to solid foods.  You may eat anything you prefer, but it is better to start with liquids, then soup and crackers, and gradually work up to solid foods.     Please notify your doctor immediately if you have any unusual bleeding, trouble breathing, redness and pain at the surgery site, drainage, fever, or pain not relieved by medication.    Additional Instructions:   Please contact your physician with any problems or Same Day Surgery at [phone],  Monday through Friday 6 am to 4 pm, or [place] at [place] Main number at [phone]. 

## 2021-07-09 ENCOUNTER — Other Ambulatory Visit
Admission: RE | Admit: 2021-07-09 | Discharge: 2021-07-09 | Disposition: A | Discharge status: Home / Self Care | Payer: Medicare Other | Source: Ambulatory Visit | Attending: Orthopedic Surgery | Admitting: Orthopedic Surgery

## 2021-07-09 ENCOUNTER — Other Ambulatory Visit: Payer: Self-pay

## 2021-07-09 HISTORY — DX: Prediabetes: R73.03

## 2021-07-09 NOTE — Patient Instructions (Addendum)
Your procedure is scheduled on: Monday July 16, 2021. Report to Day Surgery inside [place] 2nd floor, stop by admissions desk before getting on elevator. To find out your arrival time please call [phone] between 1PM - 3PM on Friday July 13, 2021.  Remember: Instructions that are not followed completely may result in serious medical risk,  up to and including death, or upon the discretion of your surgeon and anesthesiologist your  surgery may need to be rescheduled.     _X__ 1. Do not eat food after midnight the night before your procedure.                 No chewing gum or hard candies. You may drink clear liquids up to 2 hours                 before you are scheduled to arrive for your surgery- DO not drink clear                 liquids within 2 hours of the start of your surgery.                 Clear Liquids include:  water, apple juice without pulp, clear Gatorade, G2 or                  Gatorade Zero (avoid Red/Purple/Blue), Black Coffee or Tea (Do not add                 anything to coffee or tea).  __X__2.   Complete the "Ensure Clear Pre-surgery Clear Carbohydrate Drink" provided to you, 2 hours before arrival. **If you are diabetic you will be provided with an alternative drink, Gatorade Zero or G2.  __X__3.  On the morning of surgery brush your teeth with toothpaste and water, you                may rinse your mouth with mouthwash if you wish.  Do not swallow any toothpaste or mouthwash.     _X__ 4.  No Alcohol for 24 hours before or after surgery.   _X__ 5.  Do Not Smoke or use e-cigarettes For 24 Hours Prior to Your Surgery.                 Do not use any chewable tobacco products for at least 6 hours prior to                 Surgery.  _X__  6.  Do not use any recreational drugs (marijuana, cocaine, heroin, ecstasy, MDMA or other)                For at least one week prior to your surgery.  Combination of these drugs with anesthesia                 May have life threatening results.  ____  7.  Bring all medications with you on the day of surgery if instructed.   __X__8.  Notify your doctor if there is any change in your medical condition      (cold, fever, infections).     Do not wear jewelry, make-up, hairpins, clips or nail polish. Do not wear lotions, powders, or perfumes. You may wear deodorant. Do not shave 48 hours prior to surgery. Men may shave face and neck. Do not bring valuables to the hospital.    [facility] is not responsible for any belongings or valuables.  Contacts, dentures  or bridgework may not be worn into surgery. Leave your suitcase in the car. After surgery it may be brought to your room. For patients admitted to the hospital, discharge time is determined by your treatment team.   Patients discharged the day of surgery will not be allowed to drive home.   Make arrangements for someone to be with you for the first 24 hours of your Same Day Discharge.   __X__ Take these medicines the morning of surgery with A SIP OF WATER:    1. levothyroxine (SYNTHROID) 50 MCG  2. omeprazole (PRILOSEC) 20 MG   3.   4.  5.  6.  ____ Fleet Enema (as directed)   __X__ Use CHG Soap (or wipes) as directed  ____ Use Benzoyl Peroxide Gel as instructed  ____ Use inhalers on the day of surgery  ____ Stop metformin 2 days prior to surgery    ____ Take 1/2 of usual insulin dose the night before surgery. No insulin the morning          of surgery.   __X__ Ask your doctor when to stop aspirin EC 81 MG before your surgery.   __X__ One Week prior to surgery- Stop Anti-inflammatories such as Ibuprofen, Aleve, Advil, Motrin, meloxicam (MOBIC), diclofenac, etodolac, ketorolac, Toradol, Daypro, piroxicam, Goody's or BC powders. OK TO USE TYLENOL IF NEEDED   __X__ Stop supplements until after surgery.    ____ Bring C-Pap to the hospital.    If you have any questions regarding your pre-procedure  instructions,  Please call Pre-admit Testing at [phone]

## 2021-07-10 ENCOUNTER — Encounter: Payer: Self-pay | Admitting: Orthopedic Surgery

## 2021-07-10 ENCOUNTER — Other Ambulatory Visit: Payer: Medicare Other

## 2021-07-10 ENCOUNTER — Encounter
Admission: RE | Admit: 2021-07-10 | Discharge: 2021-07-10 | Disposition: A | Discharge status: Home / Self Care | Payer: Medicare Other | Source: Ambulatory Visit | Attending: Orthopedic Surgery | Admitting: Orthopedic Surgery

## 2021-07-10 DIAGNOSIS — I10 Essential (primary) hypertension: Secondary | ICD-10-CM | POA: Insufficient documentation

## 2021-07-10 DIAGNOSIS — Z01810 Encounter for preprocedural cardiovascular examination: Secondary | ICD-10-CM | POA: Diagnosis not present

## 2021-07-16 ENCOUNTER — Encounter: Payer: Self-pay | Admitting: Orthopedic Surgery

## 2021-07-16 ENCOUNTER — Ambulatory Visit: Payer: Medicare Other | Admitting: Urgent Care

## 2021-07-16 ENCOUNTER — Encounter
Admission: RE | Disposition: A | Discharge status: Home / Self Care | Payer: Self-pay | Source: Home / Self Care | Attending: Orthopedic Surgery

## 2021-07-16 ENCOUNTER — Other Ambulatory Visit: Payer: Self-pay

## 2021-07-16 ENCOUNTER — Ambulatory Visit
Admission: RE | Admit: 2021-07-16 | Discharge: 2021-07-16 | Disposition: A | Discharge status: Home / Self Care | Payer: Medicare Other | Attending: Orthopedic Surgery | Admitting: Orthopedic Surgery

## 2021-07-16 DIAGNOSIS — G5601 Carpal tunnel syndrome, right upper limb: Secondary | ICD-10-CM | POA: Insufficient documentation

## 2021-07-16 DIAGNOSIS — Z79890 Hormone replacement therapy (postmenopausal): Secondary | ICD-10-CM | POA: Diagnosis not present

## 2021-07-16 DIAGNOSIS — Z9889 Other specified postprocedural states: Secondary | ICD-10-CM

## 2021-07-16 DIAGNOSIS — I872 Venous insufficiency (chronic) (peripheral): Secondary | ICD-10-CM | POA: Insufficient documentation

## 2021-07-16 DIAGNOSIS — I10 Essential (primary) hypertension: Secondary | ICD-10-CM | POA: Diagnosis not present

## 2021-07-16 DIAGNOSIS — Z79899 Other long term (current) drug therapy: Secondary | ICD-10-CM | POA: Insufficient documentation

## 2021-07-16 DIAGNOSIS — E039 Hypothyroidism, unspecified: Secondary | ICD-10-CM | POA: Insufficient documentation

## 2021-07-16 DIAGNOSIS — Z85828 Personal history of other malignant neoplasm of skin: Secondary | ICD-10-CM | POA: Diagnosis not present

## 2021-07-16 HISTORY — DX: Unspecified cataract: H26.9

## 2021-07-16 HISTORY — PX: CARPAL TUNNEL RELEASE: SHX101

## 2021-07-16 HISTORY — DX: Ventricular premature depolarization: I49.3

## 2021-07-16 HISTORY — DX: Migraine, unspecified, not intractable, without status migrainosus: G43.909

## 2021-07-16 SURGERY — CARPAL TUNNEL RELEASE
Anesthesia: General | Site: Wrist | Laterality: Right

## 2021-07-16 MED ORDER — DEXAMETHASONE SODIUM PHOSPHATE 10 MG/ML IJ SOLN
INTRAMUSCULAR | Status: AC
  Filled 2021-07-16: qty 1

## 2021-07-16 MED ORDER — FENTANYL CITRATE (PF) 100 MCG/2ML IJ SOLN: 25.0000 ug | INTRAMUSCULAR | Status: DC | PRN

## 2021-07-16 MED ORDER — HYDROCODONE-ACETAMINOPHEN 5-325 MG PO TABS
1.0000 | ORAL_TABLET | Freq: Once | ORAL | Status: AC
  Administered 2021-07-16: 1 via ORAL

## 2021-07-16 MED ORDER — ONDANSETRON HCL 4 MG/2ML IJ SOLN
INTRAMUSCULAR | Status: DC | PRN
  Administered 2021-07-16: 4 mg via INTRAVENOUS

## 2021-07-16 MED ORDER — CEFAZOLIN SODIUM-DEXTROSE 2-4 GM/100ML-% IV SOLN
2.0000 g | INTRAVENOUS | Status: AC
  Administered 2021-07-16: 2 g via INTRAVENOUS

## 2021-07-16 MED ORDER — ACETAMINOPHEN 10 MG/ML IV SOLN
INTRAVENOUS | Status: AC
  Filled 2021-07-16: qty 100

## 2021-07-16 MED ORDER — FENTANYL CITRATE (PF) 100 MCG/2ML IJ SOLN
INTRAMUSCULAR | Status: AC
Start: 2021-07-16 — End: ?
  Filled 2021-07-16: qty 2

## 2021-07-16 MED ORDER — CELECOXIB 200 MG PO CAPS
400.0000 mg | ORAL_CAPSULE | Freq: Once | ORAL | Status: AC
Start: 2021-07-16 — End: 2021-07-16

## 2021-07-16 MED ORDER — HYDROCODONE-ACETAMINOPHEN 5-325 MG PO TABS: 1.0000 | ORAL_TABLET | ORAL | 0 refills | Status: DC | PRN

## 2021-07-16 MED ORDER — ONDANSETRON HCL 4 MG/2ML IJ SOLN
INTRAMUSCULAR | Status: AC
  Filled 2021-07-16: qty 2

## 2021-07-16 MED ORDER — APREPITANT 40 MG PO CAPS
ORAL_CAPSULE | ORAL | Status: AC
  Administered 2021-07-16: 40 mg via ORAL
  Filled 2021-07-16: qty 1

## 2021-07-16 MED ORDER — HYDROCODONE-ACETAMINOPHEN 5-325 MG PO TABS
ORAL_TABLET | ORAL | Status: AC
  Filled 2021-07-16: qty 1

## 2021-07-16 MED ORDER — FENTANYL CITRATE (PF) 100 MCG/2ML IJ SOLN
INTRAMUSCULAR | Status: DC | PRN
  Administered 2021-07-16 (×3): 12.5 ug via INTRAVENOUS
  Administered 2021-07-16 (×2): 25 ug via INTRAVENOUS
  Administered 2021-07-16: 12.5 ug via INTRAVENOUS

## 2021-07-16 MED ORDER — 0.9 % SODIUM CHLORIDE (POUR BTL) OPTIME
TOPICAL | Status: DC | PRN
  Administered 2021-07-16: 500 mL

## 2021-07-16 MED ORDER — CELECOXIB 200 MG PO CAPS
ORAL_CAPSULE | ORAL | Status: AC
  Administered 2021-07-16: 400 mg via ORAL
  Filled 2021-07-16: qty 2

## 2021-07-16 MED ORDER — PROPOFOL 500 MG/50ML IV EMUL
INTRAVENOUS | Status: DC | PRN
  Administered 2021-07-16: 75 ug/kg/min via INTRAVENOUS

## 2021-07-16 MED ORDER — CHLORHEXIDINE GLUCONATE 0.12 % MT SOLN
OROMUCOSAL | Status: AC
  Administered 2021-07-16: 15 mL via OROMUCOSAL
  Filled 2021-07-16: qty 15

## 2021-07-16 MED ORDER — BUPIVACAINE HCL (PF) 0.25 % IJ SOLN
INTRAMUSCULAR | Status: AC
  Filled 2021-07-16: qty 30

## 2021-07-16 MED ORDER — CEFAZOLIN SODIUM-DEXTROSE 2-4 GM/100ML-% IV SOLN
INTRAVENOUS | Status: AC
  Filled 2021-07-16: qty 100

## 2021-07-16 MED ORDER — METOCLOPRAMIDE HCL 10 MG PO TABS: 5.0000 mg | ORAL_TABLET | Freq: Three times a day (TID) | ORAL | Status: DC | PRN

## 2021-07-16 MED ORDER — NEOMYCIN-POLYMYXIN B GU 40-200000 IR SOLN
Status: DC | PRN
  Administered 2021-07-16: 2 mL

## 2021-07-16 MED ORDER — ONDANSETRON HCL 4 MG PO TABS: 4.0000 mg | ORAL_TABLET | Freq: Four times a day (QID) | ORAL | Status: DC | PRN

## 2021-07-16 MED ORDER — ORAL CARE MOUTH RINSE: 15.0000 mL | Freq: Once | OROMUCOSAL | Status: AC

## 2021-07-16 MED ORDER — LIDOCAINE HCL (CARDIAC) PF 100 MG/5ML IV SOSY
PREFILLED_SYRINGE | INTRAVENOUS | Status: DC | PRN
Start: 2021-07-16 — End: 2021-07-16
  Administered 2021-07-16: 40 mg via INTRAVENOUS
  Administered 2021-07-16: 60 mg via INTRAVENOUS

## 2021-07-16 MED ORDER — CHLORHEXIDINE GLUCONATE 0.12 % MT SOLN: 15.0000 mL | Freq: Once | OROMUCOSAL | Status: AC

## 2021-07-16 MED ORDER — LACTATED RINGERS IV SOLN
INTRAVENOUS | Status: DC
Start: 2021-07-16 — End: 2021-07-16

## 2021-07-16 MED ORDER — PROPOFOL 10 MG/ML IV BOLUS
INTRAVENOUS | Status: DC | PRN
  Administered 2021-07-16: 100 mg via INTRAVENOUS
  Administered 2021-07-16: 20 mg via INTRAVENOUS

## 2021-07-16 MED ORDER — LIDOCAINE HCL (PF) 2 % IJ SOLN
INTRAMUSCULAR | Status: AC
  Filled 2021-07-16: qty 5

## 2021-07-16 MED ORDER — BUPIVACAINE HCL (PF) 0.25 % IJ SOLN
INTRAMUSCULAR | Status: DC | PRN
  Administered 2021-07-16: 10 mL

## 2021-07-16 MED ORDER — FAMOTIDINE 20 MG PO TABS
ORAL_TABLET | ORAL | Status: AC
  Administered 2021-07-16: 20 mg via ORAL
  Filled 2021-07-16: qty 1

## 2021-07-16 MED ORDER — EPHEDRINE 5 MG/ML INJ
INTRAVENOUS | Status: AC
  Filled 2021-07-16: qty 5

## 2021-07-16 MED ORDER — DEXAMETHASONE SODIUM PHOSPHATE 10 MG/ML IJ SOLN
INTRAMUSCULAR | Status: DC | PRN
  Administered 2021-07-16: 5 mg via INTRAVENOUS

## 2021-07-16 MED ORDER — NEOMYCIN-POLYMYXIN B GU 40-200000 IR SOLN
Status: AC
  Filled 2021-07-16: qty 2

## 2021-07-16 MED ORDER — FAMOTIDINE 20 MG PO TABS: 20.0000 mg | ORAL_TABLET | Freq: Once | ORAL | Status: AC

## 2021-07-16 MED ORDER — ONDANSETRON HCL 4 MG/2ML IJ SOLN: 4.0000 mg | Freq: Four times a day (QID) | INTRAMUSCULAR | Status: DC | PRN

## 2021-07-16 MED ORDER — METOCLOPRAMIDE HCL 5 MG/ML IJ SOLN: 5.0000 mg | Freq: Three times a day (TID) | INTRAMUSCULAR | Status: DC | PRN

## 2021-07-16 MED ORDER — APREPITANT 40 MG PO CAPS: 40.0000 mg | ORAL_CAPSULE | Freq: Once | ORAL | Status: AC

## 2021-07-16 MED ORDER — ONDANSETRON HCL 4 MG/2ML IJ SOLN: 4.0000 mg | Freq: Once | INTRAMUSCULAR | Status: DC | PRN

## 2021-07-16 MED ORDER — ACETAMINOPHEN 10 MG/ML IV SOLN
INTRAVENOUS | Status: DC | PRN
  Administered 2021-07-16: 1000 mg via INTRAVENOUS

## 2021-07-16 SURGICAL SUPPLY — 30 items
BNDG CMPR STD VLCR NS LF 5.8X3 (GAUZE/BANDAGES/DRESSINGS) ×1
BNDG ELASTIC 3X5.8 VLCR NS LF (GAUZE/BANDAGES/DRESSINGS) ×2 IMPLANT
BNDG ELASTIC 3X5.8 VLCR STR LF (GAUZE/BANDAGES/DRESSINGS) ×1 IMPLANT
BNDG ESMARK 4X12 TAN STRL LF (GAUZE/BANDAGES/DRESSINGS) ×2 IMPLANT
CAST PADDING 3X4FT ST 30246 (SOFTGOODS) ×1
CUFF TOURN SGL QUICK 18X4 (TOURNIQUET CUFF) ×2 IMPLANT
DRAPE U-SHAPE 47X51 STRL (DRAPES) ×2 IMPLANT
DRSG DERMACEA 8X12 NADH (GAUZE/BANDAGES/DRESSINGS) ×2 IMPLANT
DURAPREP 26ML APPLICATOR (WOUND CARE) ×2 IMPLANT
ELECT CAUTERY BLADE 6.4 (BLADE) ×2 IMPLANT
ELECT REM PT RETURN 9FT ADLT (ELECTROSURGICAL) ×2
ELECTRODE REM PT RTRN 9FT ADLT (ELECTROSURGICAL) ×1 IMPLANT
GAUZE SPONGE 4X4 12PLY STRL (GAUZE/BANDAGES/DRESSINGS) ×2 IMPLANT
GLOVE SURG ENC TEXT LTX SZ7.5 (GLOVE) ×2 IMPLANT
GLOVE SURG UNDER LTX SZ8 (GLOVE) ×2 IMPLANT
GOWN STRL REUS W/ TWL LRG LVL3 (GOWN DISPOSABLE) ×2 IMPLANT
GOWN STRL REUS W/TWL LRG LVL3 (GOWN DISPOSABLE) ×4
KIT TURNOVER KIT A (KITS) ×2 IMPLANT
MANIFOLD NEPTUNE II (INSTRUMENTS) ×2 IMPLANT
NS IRRIG 500ML POUR BTL (IV SOLUTION) ×2 IMPLANT
PACK EXTREMITY ARMC (MISCELLANEOUS) ×2 IMPLANT
PAD CAST CTTN 3X4 STRL (SOFTGOODS) ×1 IMPLANT
PADDING CAST COTTON 3X4 STRL (SOFTGOODS) ×1
SOL PREP PVP 2OZ (MISCELLANEOUS) ×2
SOLUTION PREP PVP 2OZ (MISCELLANEOUS) ×1 IMPLANT
SPLINT CAST 1 STEP 3X12 (MISCELLANEOUS) ×2 IMPLANT
STOCKINETTE 48X4 2 PLY STRL (GAUZE/BANDAGES/DRESSINGS) ×1 IMPLANT
STOCKINETTE STRL 4IN 9604848 (GAUZE/BANDAGES/DRESSINGS) ×2 IMPLANT
SUT ETHILON 5-0 FS-2 18 BLK (SUTURE) ×2 IMPLANT
WATER STERILE IRR 500ML POUR (IV SOLUTION) ×2 IMPLANT

## 2021-07-16 NOTE — H&P (Signed)
The patient has been re-examined, and the chart reviewed, and there have been no interval changes to the documented history and physical.    The risks, benefits, and alternatives have been discussed at length. The patient expressed understanding of the risks benefits and agreed with plans for surgical intervention.  Rashea Hoskie P. Kennett Symes, Jr. M.D.    

## 2021-07-16 NOTE — Anesthesia Preprocedure Evaluation (Addendum)
Anesthesia Evaluation  Patient identified by MRN, date of birth, ID band Patient awake    Reviewed: Allergy & Precautions, NPO status , Patient's Chart, lab work & pertinent test results  History of Anesthesia Complications (+) PONV and history of anesthetic complications  Airway Mallampati: II  TM Distance: >3 FB Neck ROM: full    Dental no notable dental hx.    Pulmonary neg pulmonary ROS,    Pulmonary exam normal        Cardiovascular Exercise Tolerance: Good hypertension, Pt. on medications Normal cardiovascular exam     Neuro/Psych  Headaches, 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  Neuromuscular disease (Right carpal tunnel syndrome) negative psych ROS   GI/Hepatic Neg liver ROS, GERD  Medicated and Controlled,  Endo/Other  Hypothyroidism   Renal/GU CRFRenal disease  negative genitourinary   Musculoskeletal  (+) Arthritis ,   Abdominal   Peds  Hematology negative hematology ROS (+)   Anesthesia Other Findings Past Medical History: No date: Anemia No date: Arthritis     Comment:  joints/ knees and back No date: Asymptomatic PVCs No date: Carpal tunnel syndrome of right wrist No date: Cataracts, bilateral     Comment:  a.) s/p extraction No date: 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 No date: Chronic kidney disease No date: GERD (gastroesophageal reflux disease) No date: Hyperlipidemia No date: Hypertension No date: Hypothyroidism No date: Migraines No date: PONV (postoperative nausea and vomiting) No date: Pre-diabetes 2000: Skin cancer of face No date: Subdural hematoma No date: Superficial thrombophlebitis No date: Venous insufficiency  Past Surgical History: 1948: APPENDECTOMY 09/29/2019: CATARACT EXTRACTION W/PHACO; Right     Comment:  Procedure: CATARACT EXTRACTION PHACO AND INTRAOCULAR               LENS PLACEMENT ([facility]) RIGHT 3.43 00:32.4 10.6%;  Surgeon:                Leandrew Koyanagi, MD;  Location: [facility];              Service: Ophthalmology;  Laterality: Right; 10/27/2019: CATARACT EXTRACTION W/PHACO; Left     Comment:  Procedure: CATARACT EXTRACTION PHACO AND INTRAOCULAR               LENS PLACEMENT ([place]) LEFT;  Surgeon: Leandrew Koyanagi, MD;  Location: [facility];  Service:               Ophthalmology;  Laterality: Left;  3.25 0:42.3 7.6% No date: COLONOSCOPY 09/24/2017: COLONOSCOPY WITH PROPOFOL; N/A     Comment:  Procedure: COLONOSCOPY WITH PROPOFOL;  Surgeon: Manya Silvas, MD;  Location: [facility] ENDOSCOPY;  Service:               Endoscopy;  Laterality: N/A; No date: DILATION AND CURETTAGE OF UTERUS No date: edg 06/2017: HERNIA REPAIR     Comment:  inguinal hernia 07/11/2017: INGUINAL HERNIA REPAIR; Right     Comment:  Procedure: HERNIA REPAIR INGUINAL ADULT;  Surgeon:               Leonie Green, MD;  Location: [facility] ORS;  Service:               General;  Laterality: Right; No date: JOINT REPLACEMENT; Right     Comment:  Right knee replacement 12/10/2017: KNEE ARTHROPLASTY;  Right     Comment:  Procedure: COMPUTER ASSISTED TOTAL KNEE ARTHROPLASTY;                Surgeon: Dereck Leep, MD;  Location: [facility] ORS;                Service: Orthopedics;  Laterality: Right; No date: KNEE ARTHROSCOPY; Right No date: skin cancer removed from face 2011: [place]     Comment:  patient hit head with trunk. no residual effects     Reproductive/Obstetrics negative OB ROS                            Anesthesia Physical Anesthesia Plan  ASA: 2  Anesthesia Plan: General   Post-op Pain Management: Minimal or no pain anticipated   Induction: Intravenous  PONV Risk Score and Plan: Propofol infusion and TIVA  Airway Management Planned: LMA  Additional Equipment:   Intra-op Plan:   Post-operative Plan: Extubation in  OR  Informed Consent: I have reviewed the patients History and Physical, chart, labs and discussed the procedure including the risks, benefits and alternatives for the proposed anesthesia with the patient or authorized representative who has indicated his/her understanding and acceptance.     Dental advisory given  Plan Discussed with: Anesthesiologist, CRNA and Surgeon  Anesthesia Plan Comments:        Anesthesia Quick Evaluation

## 2021-07-16 NOTE — Anesthesia Postprocedure Evaluation (Signed)
Anesthesia Post Note  Patient: Pamela Conley  Procedure(s) Performed: CARPAL TUNNEL RELEASE (Right: Wrist)  Patient location during evaluation: PACU Anesthesia Type: General Level of consciousness: awake and alert Pain management: pain level controlled Vital Signs Assessment: post-procedure vital signs reviewed and stable Respiratory status: spontaneous breathing, nonlabored ventilation, respiratory function stable and patient connected to nasal cannula oxygen Cardiovascular status: blood pressure returned to baseline and stable Postop Assessment: no apparent nausea or vomiting Anesthetic complications: no   No notable events documented.   Last Vitals:  Vitals:   07/16/21 1701 07/16/21 1715  BP: 140/69 132/74  Pulse: (!) 54 (!) 57  Resp: 12 15  Temp:  (!) 36.4 C  SpO2: 99% 98%    Last Pain:  Vitals:   07/16/21 1715  PainSc: [name]

## 2021-07-16 NOTE — H&P (Signed)
ORTHOPAEDIC HISTORY & PHYSICAL Gwenlyn Fudge, [place] - 07/10/2021 10:45 AM EST Formatting of this note is different from the original. [facility] Chief Complaint:   Chief Complaint  Patient presents with   Pre-op Exam  H&P Rt CTR 07/16/2021   History of Present Illness:   Pamela Conley is a 81 y.o. female that presents to clinic today for her preoperative history and evaluation. Patient presents unaccompanied. The patient is scheduled to undergo a right carpal tunnel release on 07/16/21 by Dr. Marry Guan. The patient reports a long history of numbness/tingling in the right hand. Patient denies any history of injury to the hand.   The patient's symptoms have progressed to the point that they decrease her quality of life. The patient has previously undergone conservative treatment including NSAIDS and activity modification without adequate control of her symptoms.  Patient previously saw Dr Ubaldo Glassing in cardiology. She is now followed by Dr Nehemiah Massed.   Past Medical, Surgical, Family, Social History, Allergies, Medications:   Past Medical History:  Past Medical History:  Diagnosis Date   Anemia, unspecified   Cervical disc disease   Degenerative arthritis   Essential hypertension, benign   GERD (gastroesophageal reflux disease)   H/O hernia repair 07/11/2017   H/O mammogram 07/27/2013   History of bone density study 07/08/2012   History of idiopathic urticaria   Mild renal insufficiency   Other and unspecified hyperlipidemia   Skin cancer   Subdural hematoma   Superficial thrombophlebitis  with varicose veins   Unspecified hypothyroidism   Venous insufficiency   Past Surgical History:  Past Surgical History:  Procedure Laterality Date   Repeat skin cancer surgery on the left naris. 2003   KNEE ARTHROSCOPY Right 06/28/2002   COLONOSCOPY 08/05/2003  Normal Colon   SUBDURAL HEMATOMA EVACUATION VIA CRANIOTOMY 08/18/2009   EGD  08/24/2012  No repeat per RTE   INGUINAL HERNIA REPAIR Right 07/11/2017  Dr Rochel Brome   UMBILICAL HERNIA REPAIR 10/62/6948   COLONOSCOPY 08/24/2017  FH Colon Polyps (Daughter): CBF 08/2017; Recall Ltr mailed 07/17/2017 (dh)   COLONOSCOPY 09/24/2017  Adenomatous Polyps; FHCP (Daughter) CHF 09/2022   Right total knee arthroplasty using computer-assisted navigation 12/10/2017  Dr Marry Guan   CATARACT EXTRACTION Right 09/29/2019   CATARACT EXTRACTION Left 10/27/2019   APPENDECTOMY   Basal cell carcinoma removal from the left naris with some cosmetic surgery following that.   Status post D&C   Current Medications:  Current Outpatient Medications  Medication Sig Dispense Refill   acetaminophen (TYLENOL) 500 MG tablet Take 1,000 mg by mouth every 6 (six) hours as needed   aspirin 81 MG EC tablet Take 81 mg by mouth once daily.   calcium carbonate-vit D3-min 600 mg calcium- 200 unit Tab Take 1 tablet by mouth 2 (two) times daily.   cholecalciferol (VITAMIN D3) 1000 unit tablet Take 1,000 Units by mouth once daily.   cyanocobalamin 2000 MCG tablet Take 1,000 mcg by mouth once daily   docusate (DOK) 100 mg tablet Take by mouth Take 100 mg by mouth daily as needed for constipation.   etodolac (LODINE) 400 MG tablet Take 1 tablet (400 mg total) by mouth once daily 90 tablet 1   Lactobac no.41/Bifidobact no.7 (PROBIOTIC-10 ORAL) Take 1 capsule by mouth once daily   levothyroxine (SYNTHROID) 50 MCG tablet Take 1 tablet (50 mcg total) by mouth once daily Take on an empty stomach with a glass of water at least 30-60 minutes before breakfast.  90 tablet 1   methylcellulose (CITRUCEL) 500 mg tablet Take 500 mg by mouth as needed   omeprazole (PRILOSEC) 20 MG DR capsule TAKE 1 CAPSULE BY MOUTH ONCE DAILY 90 capsule 3   propylene glycoL (SYSTANE BALANCE) 0.6 % ophthalmic drops Place 1 drop into both eyes 2 (two) times daily as needed   ramipriL (ALTACE) 2.5 MG capsule Take 1 capsule (2.5 mg total) by mouth  once daily 90 capsule 1   triamterene-hydrochlorothiazide (MAXZIDE-25) 37.5-25 mg tablet TAKE 1 TABLET BY MOUTH ONCE DAILY 90 tablet 3   white petrolatum-mineral oiL (REFRESH PM) 57.3-42.5 % ophthalmic ointment Place 1 Application into both eyes at bedtime as needed   zinc gluconate 50 mg tablet Take 50 mg by mouth once daily   No current facility-administered medications for this visit.   Allergies:  Allergies  Allergen Reactions   Codeine Hives and Nausea   Nexium [Esomeprazole Magnesium] Hives   Sulfa (Sulfonamide Antibiotics) Nausea   Social History:  Social History   Socioeconomic History   Marital status: Widowed   Number of children: 3   Years of education: 12   Highest education level: High school graduate  Occupational History   Occupation: Retired  Tobacco Use   Smoking status: Never   Smokeless tobacco: Never  Scientific laboratory technician Use: Never used  Substance and Sexual Activity   Alcohol use: No  Alcohol/week: 0.0 standard drinks   Drug use: No   Sexual activity: Not Currently  Partners: Male   Family History:  Family History  Problem Relation Age of Onset   Diabetes type II Mother   Hodgkin's lymphoma Mother   Coronary Artery Disease (Blocked arteries around heart) Father   Diabetes type II Sister   Coronary Artery Disease (Blocked arteries around heart) Brother   Colon polyps Daughter   Review of Systems:   A 10+ ROS was performed, reviewed, and the pertinent orthopaedic findings are documented in the HPI.   Physical Examination:   BP 110/70   Ht 165.1 cm (5\' 5" )   Wt 70.3 kg (155 lb)   BMI 25.79 kg/m   Patient is a well-developed, well-nourished female in no acute distress. Patient has normal mood and affect. Patient is alert and oriented to person, place, and time.   HEENT: Atraumatic, normocephalic. Pupils equal and reactive to light. Extraocular motion intact. Noninjected sclera.  Cardiovascular: Regular rate and rhythm, with no murmurs, rubs,  or gallops. Distal pulses palpable. No bruits.   Respiratory: Lungs clear to auscultation bilaterally.   Right hand:     Tenderness:                Negative Erythema:                    negative Swelling:                      negative Capillary Refill:            normal Thenar atrophy:          negative Intrinsic wasting:         negative Grip strength:              fair to good grip strength Pincer strength:           fair to good pincer strength Tinel`s test:                 positive Phalen`s test:  equivocal Triggering:                   No gross triggering or locking of the digits Finkelstein`s test:        negative Range of motion:        Good range of motion of the digits  Sensation decreased to pinprick over the entire palmar aspect of the hand. Intact to pinprick over the radial nerve distribution and dorsal side of the ulnar nerve distribution. Able to make an okay sign, crisscross the second third digits, and make a thumbs up. Radial pulse 2+  Tests Performed/Reviewed:  X-rays  No new radiographs were obtained today.   Impression:   ICD-10-CM  1. Carpal tunnel syndrome, right G56.01   Plan:   -carpal tunnel of the right hand The patient will benefit from a right carpal tunnel release. The risks of surgery, including infection and blood clots, were discussed with the patient. Having failed conservative treatment, the patient has elected to proceed with surgery. The risks of surgery, including blood clots and infection, were discussed with the patient. Measures taken to reduce these risks were also discussed with the patient. The postoperative course was discussed with the patient. Patient was instructed to stop all blood thinners prior to surgery. Patient understands and elects to proceed with surgery.  Contact our office with any questions or concerns. Follow up as indicated, or sooner should any new problems arise, if conditions worsen, or if they are  otherwise concerned.   Gwenlyn Fudge, PA-C [facility] [address], [place] [address] Phone: [phone]  This note was generated in part with voice recognition software and I apologize for any typographical errors that were not detected and corrected.  Electronically signed by Gwenlyn Fudge, PA at 07/10/2021 1:40 PM EST  Back to top of Progress Notes

## 2021-07-16 NOTE — Anesthesia Procedure Notes (Signed)
Procedure Name: LMA Insertion Date/Time: 07/16/2021 3:38 PM Performed by: Lia Foyer, CRNA Pre-anesthesia Checklist: Patient identified, Emergency Drugs available, Suction available and Patient being monitored Patient Re-evaluated:Patient Re-evaluated prior to induction Oxygen Delivery Method: Circle system utilized Preoxygenation: Pre-oxygenation with 100% oxygen Induction Type: IV induction Ventilation: Mask ventilation without difficulty LMA: LMA flexible inserted LMA Size: 3.5 Number of attempts: 1 Airway Equipment and Method: Patient positioned with wedge pillow Placement Confirmation: positive ETCO2 and breath sounds checked- equal and bilateral Tube secured with: Tape Dental Injury: Teeth and Oropharynx as per pre-operative assessment

## 2021-07-16 NOTE — Transfer of Care (Signed)
Immediate Anesthesia Transfer of Care Note  Patient: Pamela Conley  Procedure(s) Performed: CARPAL TUNNEL RELEASE (Right: Wrist)  Patient Location: PACU  Anesthesia Type:General  Level of Consciousness: drowsy  Airway & Oxygen Therapy: Patient Spontanous Breathing  Post-op Assessment: Report given to RN and Post -op Vital signs reviewed and stable  Post vital signs: Reviewed and stable  Last Vitals:  Vitals Value Taken Time  BP 123/70 07/16/21 1639  Temp    Pulse 65 07/16/21 1641  Resp 10 07/16/21 1641  SpO2 97 % 07/16/21 1641  Vitals shown include unvalidated device data.  Last Pain:  Vitals:   07/16/21 1500  PainSc: 0-No pain         Complications: No notable events documented.

## 2021-07-16 NOTE — Op Note (Signed)
OPERATIVE NOTE  DATE OF SURGERY:  07/16/2021  PATIENT NAME:  Pamela Conley   DOB: 1941/03/19  MRN: [id number]  PRE-OPERATIVE DIAGNOSIS: Right carpal tunnel syndrome  POST-OPERATIVE DIAGNOSIS:  Same  PROCEDURE:  Right carpal tunnel release  SURGEON:  Marciano Sequin. M.D.  ANESTHESIA: general  ESTIMATED BLOOD LOSS:minimal  FLUIDS REPLACED: 500 mL of crystalloid  TOURNIQUET TIME: 28 minutes  DRAINS: None  INDICATIONS FOR SURGERY: [name] is a 81 y.o. year old female with a long history of numbness and paresthesias to the right hand. EMG/nerve conduction studies demonstrated findings consistent with carpal tunnel syndrome.The patient had not seen any significant improvement despite conservative nonsurgical intervention. After discussion of the risks and benefits of surgical intervention, the patient expressed understanding of the risks benefits and agree with plans for carpal tunnel release.   PROCEDURE IN DETAIL: The patient was brought into the operating room and after adequate general anesthesia, a tourniquet was placed on the patient's right forearm.The right hand and arm were prepped with alcohol and Duraprep and draped in the usual sterile fashion. A "time-out" was performed as per usual protocol. The hand and forearm were exsanguinated using an Esmarch and the tourniquet was inflated to 250 mmHg. Loupe magnification was used throughout the procedure. An incision was made just ulnar to the thenar palmar crease. Dissection was carried down through the palmar fascia to the transverse carpal ligament. The transverse carpal ligament was sharply incised, taking care to protect the underlying structures with the carpal tunnel. Complete release of the transverse carpal ligament was achieved. There was no evidence of ganglion cyst or lipoma within the carpal tunnel. The wound was irrigated with copious amounts of normal saline with antibiotic solution. The skin was then  re-approximated with interrupted sutures of #5-0 nylon.  10 mL of 0.25% Marcaine was injected along the incision site.  A sterile dressing was applied followed by application of a volar splint. The tourniquet was deflated with a total tourniquet time of 28 minutes.  The patient tolerated the procedure well and was transported to the PACU in stable condition.  Archita Lomeli P. Holley Bouche., M.D.

## 2021-07-17 ENCOUNTER — Encounter: Payer: Self-pay | Admitting: Orthopedic Surgery

## 2021-09-02 DIAGNOSIS — Z9889 Other specified postprocedural states: Secondary | ICD-10-CM | POA: Insufficient documentation

## 2021-10-02 ENCOUNTER — Other Ambulatory Visit: Payer: Self-pay | Admitting: Physician Assistant

## 2021-10-02 DIAGNOSIS — Z1231 Encounter for screening mammogram for malignant neoplasm of breast: Secondary | ICD-10-CM

## 2021-11-12 ENCOUNTER — Ambulatory Visit
Admission: RE | Admit: 2021-11-12 | Discharge: 2021-11-12 | Disposition: A | Discharge status: Home / Self Care | Payer: Medicare Other | Source: Ambulatory Visit | Attending: Physician Assistant | Admitting: Physician Assistant

## 2021-11-12 DIAGNOSIS — Z1231 Encounter for screening mammogram for malignant neoplasm of breast: Secondary | ICD-10-CM | POA: Insufficient documentation

## 2021-11-15 DIAGNOSIS — R7303 Prediabetes: Secondary | ICD-10-CM | POA: Insufficient documentation

## 2022-01-24 ENCOUNTER — Other Ambulatory Visit: Payer: Self-pay | Admitting: Physician Assistant

## 2022-01-24 DIAGNOSIS — M5416 Radiculopathy, lumbar region: Secondary | ICD-10-CM

## 2022-01-31 ENCOUNTER — Ambulatory Visit
Admission: RE | Admit: 2022-01-31 | Discharge: 2022-01-31 | Disposition: A | Discharge status: Home / Self Care | Payer: Medicare Other | Source: Ambulatory Visit | Attending: Physician Assistant | Admitting: Physician Assistant

## 2022-01-31 DIAGNOSIS — M5416 Radiculopathy, lumbar region: Secondary | ICD-10-CM | POA: Diagnosis present

## 2022-10-07 ENCOUNTER — Other Ambulatory Visit: Payer: Self-pay

## 2022-10-07 DIAGNOSIS — Z1231 Encounter for screening mammogram for malignant neoplasm of breast: Secondary | ICD-10-CM

## 2022-11-14 ENCOUNTER — Ambulatory Visit
Admission: RE | Admit: 2022-11-14 | Discharge: 2022-11-14 | Disposition: A | Discharge status: Home / Self Care | Payer: Medicare Other | Source: Ambulatory Visit | Attending: Physician Assistant | Admitting: Physician Assistant

## 2022-11-14 DIAGNOSIS — Z1231 Encounter for screening mammogram for malignant neoplasm of breast: Secondary | ICD-10-CM | POA: Insufficient documentation

## 2023-05-02 IMAGING — MG MM DIGITAL SCREENING BILAT W/ TOMO AND CAD
8 series · 9 of 24 positions shown · non-contrast
Comparison: Previous exam(s).

CLINICAL DATA: Screening.

EXAM:
DIGITAL SCREENING BILATERAL MAMMOGRAM WITH TOMOSYNTHESIS AND CAD
TECHNIQUE: Bilateral screening digital craniocaudal and mediolateral oblique
mammograms were obtained. Bilateral screening digital breast
tomosynthesis was performed. The images were evaluated with
computer-aided detection.

[R MLO synth-2D]
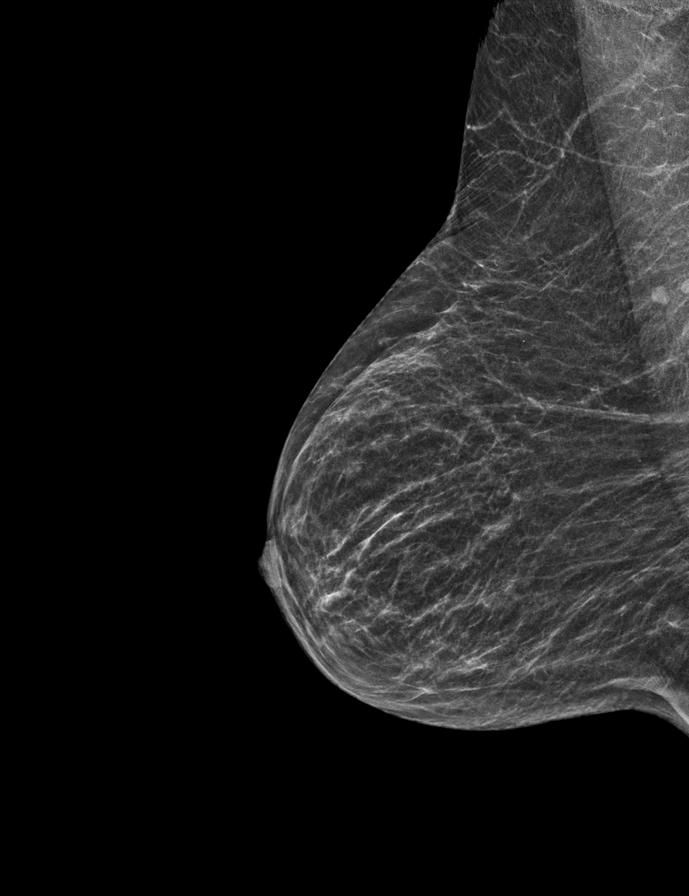

[R CC synth-2D]
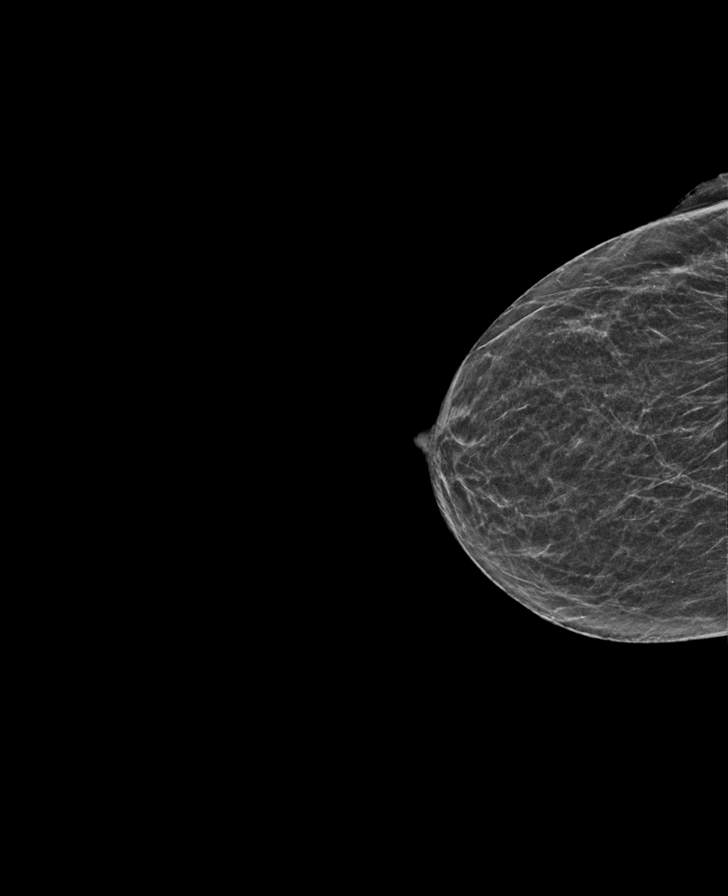

[L CC synth-2D]
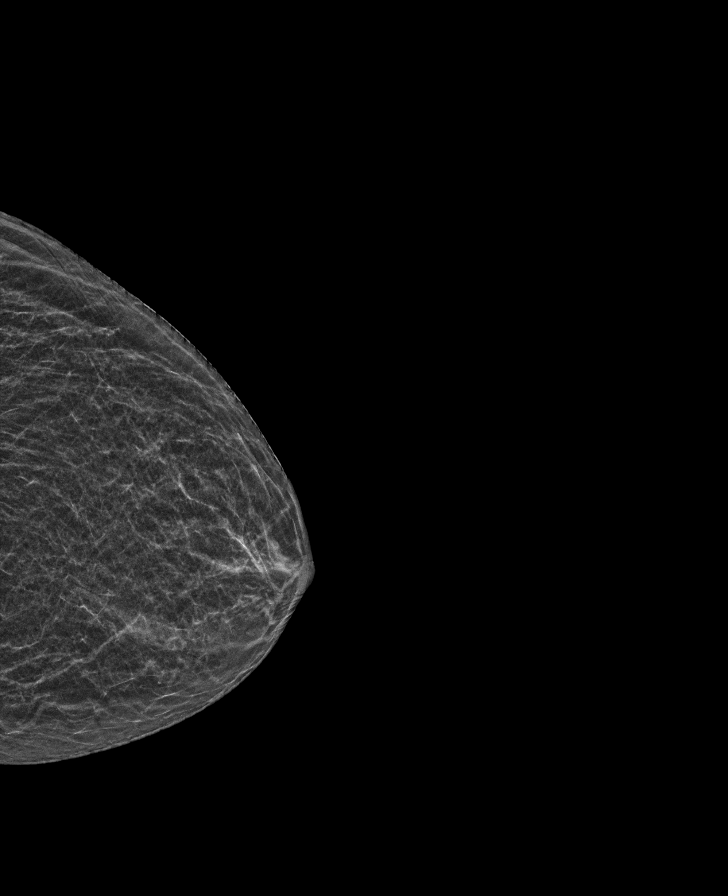

[L MLO synth-2D]
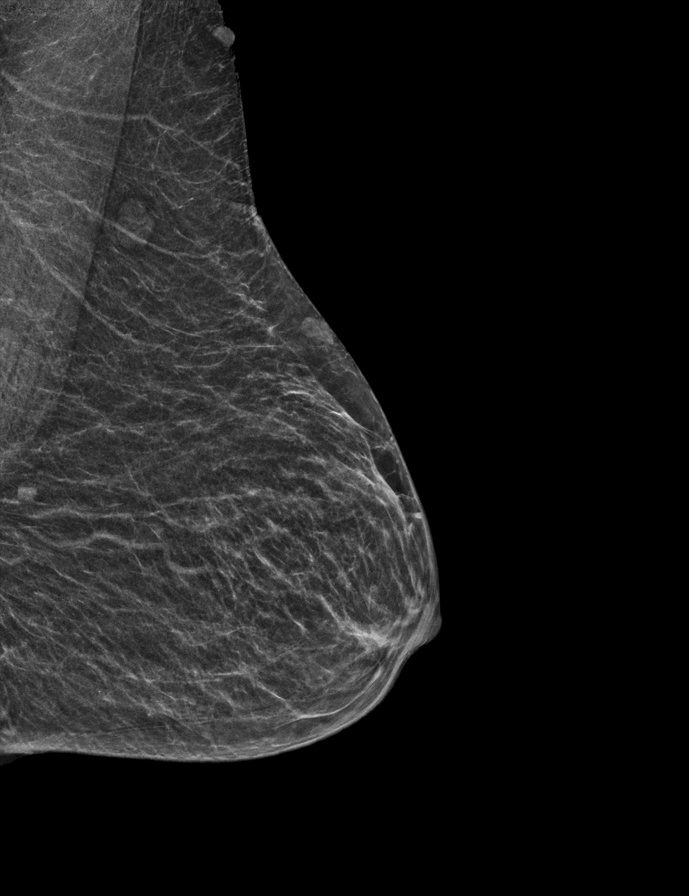

[R CC tomo · 2 of 41 frames shown]
[frame 14/41]
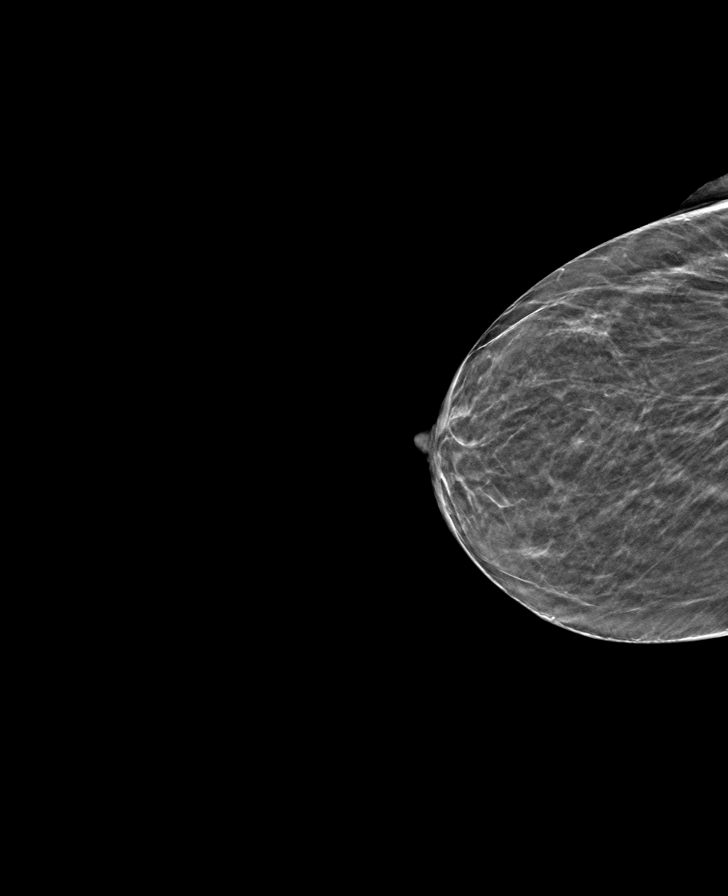
[frame 21/41]
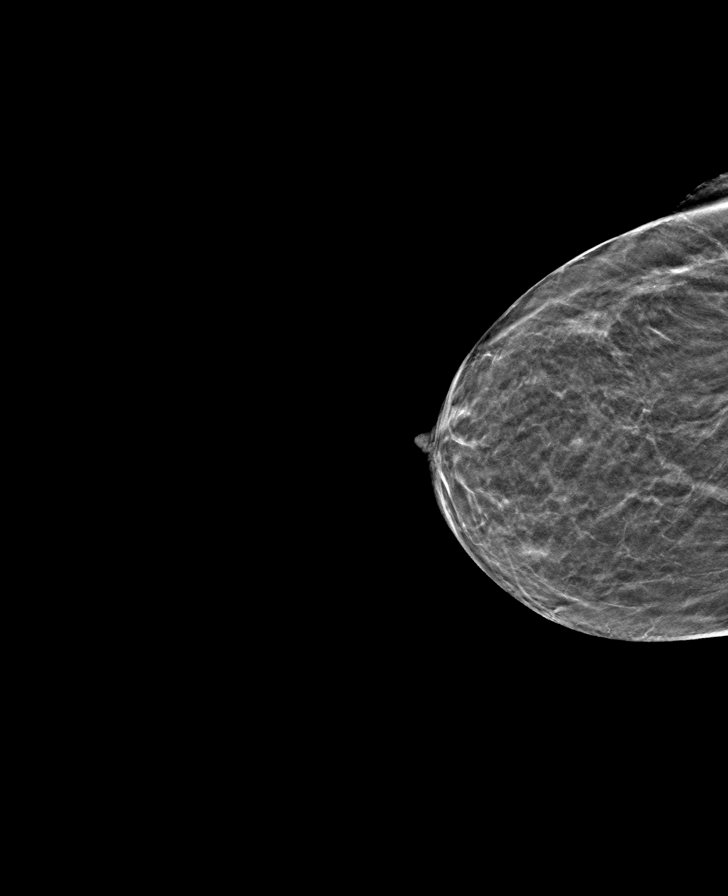

[R MLO tomo · tomo slice 21/40.0]
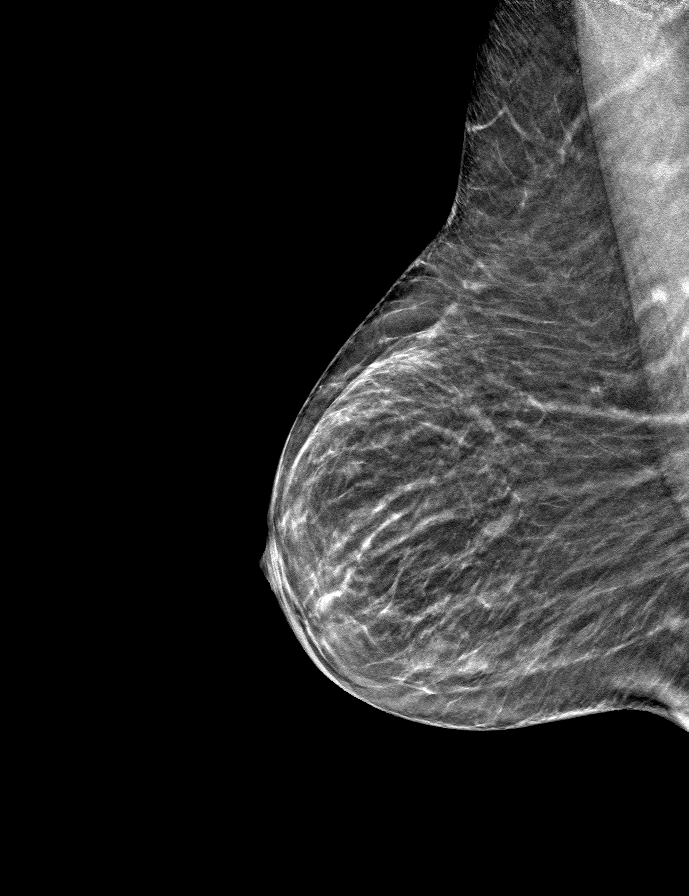

[L CC tomo · tomo slice 18/35.0]
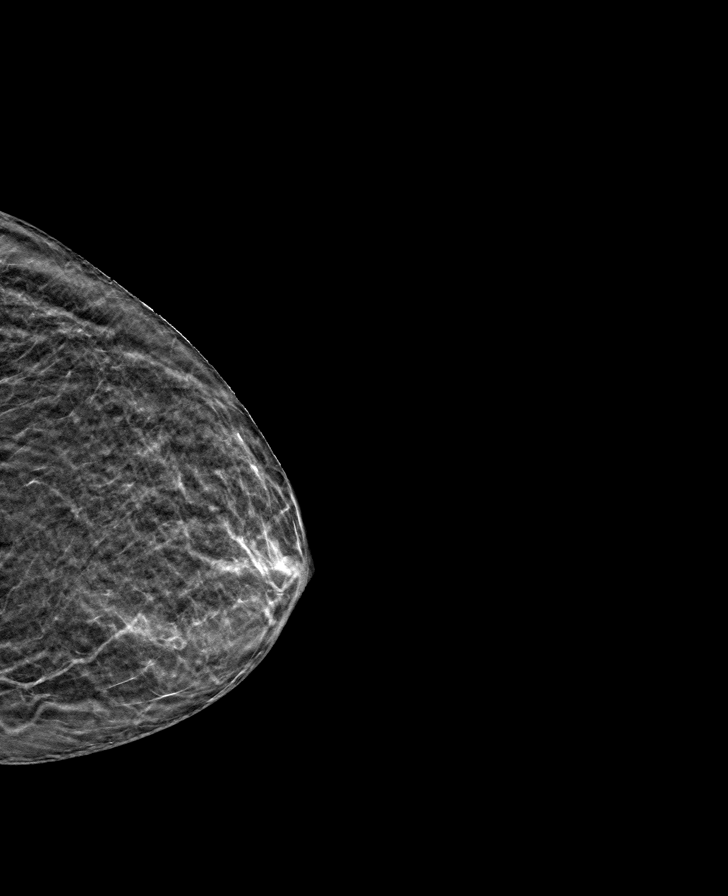

[L MLO tomo · tomo slice 19/38.0]
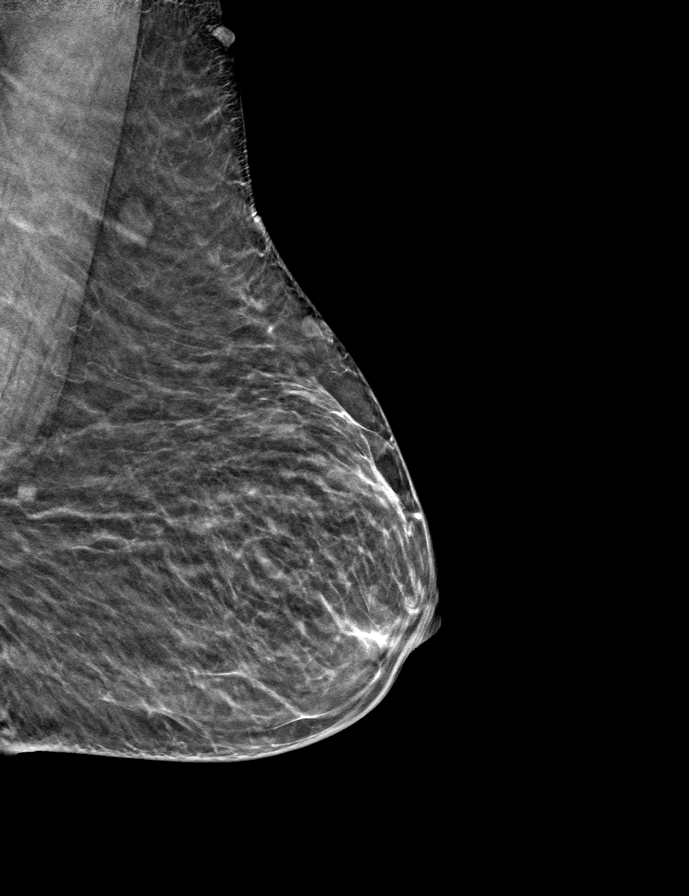

[9 of 24 positions shown; findings below may reference images not displayed]

ACR Breast Density Category b: There are scattered areas of
fibroglandular density.
FINDINGS: There are no findings suspicious for malignancy.
IMPRESSION: No mammographic evidence of malignancy. A result letter of this
screening mammogram will be mailed directly to the patient.

RECOMMENDATION:
Screening mammogram in one year. (Code:51-O-LD2)

BI-RADS CATEGORY  1: Negative.

## 2023-05-29 ENCOUNTER — Other Ambulatory Visit: Payer: Self-pay | Admitting: Physician Assistant

## 2023-05-29 DIAGNOSIS — Z1231 Encounter for screening mammogram for malignant neoplasm of breast: Secondary | ICD-10-CM

## 2023-11-06 NOTE — H&P (Signed)
 ORTHOPAEDIC HISTORY & PHYSICAL Pamela Conley, Pamela Conley., [place] - 10/31/2023 9:00 AM EDT Formatting of this note is different from the original. Images from the original note were not included. Chief Complaint: Chief Complaint Patient presents with Left Wrist - Pain Lt wrist Carpal Tunnel **previously evaluate with Pamela Conley**    Reason for Visit: The patient is a 83 y.o. female who presents today for reevaluation of her left hand and wrist. She continues to report pain and numbness to the left hand. She has not appreciated any improvement with the use of wrist splints. She appreciated temporary relief following ultrasound-guided corticosteroid injection to the left carpal tunnel. Previous EMG/NCV demonstrated moderate to severe left carpal tunnel syndrome.  Medications: Current Outpatient Medications Medication Sig Dispense Refill acetaminophen  (TYLENOL ) 500 MG tablet Take 1,000 mg by mouth every 6 (six) hours as needed amoxicillin (AMOXIL) 500 MG capsule calcium  carbonate-vit D3-min 600 mg calcium - 200 unit Tab Take 1 tablet by mouth 2 (two) times daily. cholecalciferol  (VITAMIN D3) 1000 unit tablet Take 1,000 Units by mouth once daily cyanocobalamin 2000 MCG tablet Take 2,000 mcg by mouth once daily etodolac (LODINE) 400 MG tablet TAKE 1 TABLET BY MOUTH ONCE DAILY 90 tablet 3 Lactobac no.41/Bifidobact no.7 (PROBIOTIC-10 ORAL) Take 1 capsule by mouth once daily levothyroxine  (SYNTHROID ) 25 MCG tablet TAKE 1 TABLET BY MOUTH ONCE DAILY ON SATURDAY AND SUNDAY TAKE ON AN EMPTY STOMACH WITH A GLASS OF WATER AT LEAST 30 TO 60 MINUTES BEFORE BREAKFAST 26 tablet 3 levothyroxine  (SYNTHROID ) 50 MCG tablet Take 1 tablet (50 mcg total) by mouth every morning before breakfast (0630) Take on an empty stomach with a glass of water at least 30-60 minutes before breakfast. 90 tablet 3 methylcellulose (CITRUCEL) 500 mg tablet Take 500 mg by mouth as needed omeprazole (PRILOSEC) 20 MG DR capsule TAKE 1 CAPSULE  BY MOUTH ONCE DAILY 90 capsule 3 propylene glycoL (SYSTANE BALANCE) 0.6 % ophthalmic drops Place 1 drop into both eyes 2 (two) times daily as needed ramipriL  (ALTACE ) 2.5 MG capsule TAKE 1 CAPSULE BY MOUTH ONCE DAILY (Patient taking differently: Take on Saturday and Sunday) 90 capsule 2 triamterene -hydroCHLOROthiazide  (DYAZIDE ) 37.5-25 mg capsule TAKE 1 CAPSULE BY MOUTH IN THE MORNING 90 capsule 2 white petrolatum -mineral oiL (REFRESH PM) 57.3-42.5 % ophthalmic ointment Place 1 Application into both eyes at bedtime as needed zinc gluconate 50 mg tablet Take 50 mg by mouth once daily ramipriL  (ALTACE ) 5 MG capsule Take 1 capsule (5 mg total) by mouth once daily (Patient taking differently: Take 5 mg by mouth once daily Takes 5mg  daily Monday - Friday) 90 capsule 4  No current facility-administered medications for this visit.  Allergies: Allergies Allergen Reactions Codeine Hives and Nausea Nexium [Esomeprazole Magnesium ] Hives Sulfa (Sulfonamide Antibiotics) Nausea  Past Medical History: Past Medical History: Diagnosis Date Anemia, unspecified Cervical disc disease Degenerative arthritis Essential hypertension, benign GERD (gastroesophageal reflux disease) H/O hernia repair 07/11/2017 H/O mammogram 07/27/2013 History of bone density study 07/08/2012 History of idiopathic urticaria Mild renal insufficiency Other and unspecified hyperlipidemia Skin cancer Subdural hematoma (CMS/HHS-HCC) Superficial thrombophlebitis with varicose veins Unspecified hypothyroidism Venous insufficiency  Past Surgical History: Past Surgical History: Procedure Laterality Date Repeat skin cancer surgery on the left naris. 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 Status post D&C TUBAL LIGATION  Social History: Social History  Socioeconomic History Marital status: Widowed Number of children: 3 Years of education: 12 Highest education level: High school graduate Occupational History Occupation: Retired Tobacco Use Smoking status: Never Smokeless tobacco: Never Vaping Use Vaping status: Never Used Substance and Sexual Activity Alcohol use: No Alcohol/week: 0.0 standard drinks of alcohol Drug use: No Sexual activity: Not Currently Partners: Male  Social Drivers of Health  Financial Resource Strain: Low Risk (05/29/2023) Overall Financial Resource Strain (CARDIA) Difficulty of Paying Living Expenses: Not hard at all Food Insecurity: No Food Insecurity (05/29/2023) Hunger Vital Sign Worried About Running Out of Food in the Last Year: Never true Ran Out of Food in the Last Year: Never true Transportation Needs: No Transportation Needs (05/29/2023) PRAPARE - Contractor (Medical): No Lack of Transportation (Non-Medical): No Housing Stability: Unknown (05/29/2023) Housing Stability Vital Sign Unable to Pay for Housing in the Last Year: No Homeless in the Last Year: No  Family History: Family History Problem Relation Name Age of Onset Diabetes type II Mother Hodgkin's lymphoma Mother Coronary Artery Disease (Blocked arteries around heart) Father Heart disease Father Diabetes type II Sister Coronary Artery Disease (Blocked arteries around heart)  Brother Colon polyps Daughter Diabetes type II Sister Heart disease Son Heart disease Brother  Review of Systems: A comprehensive 14 point ROS was performed, reviewed, and the pertinent orthopaedic findings are documented in the HPI.  Exam BP 134/70  Ht 162.6 cm (5' 4.02)  Wt 64 kg (141 lb 3.2 oz)  BMI 24.22 kg/m  General: Well-developed, well-nourished female seen in no acute distress.  HEENT: Atraumatic, normocephalic. Pupils are equal and reactive to light. Extraocular motion is intact. Sclera are clear. Oropharynx is clear with moist mucosa.  Neck: Good range of motion. No tenderness to palpation. Spurling`s test is negative.  Lungs: Clear to auscultation bilaterally.  Cardiovascular: Regular rate and rhythm. Normal S1, S2. No murmur . No appreciable gallops or rubs. Peripheral pulses are palpable.  Extremities: Normal shoulder contour. Good range of motion and stability of the shoulders, elbows, and wrists. Tinel`s test at the elbow is negative.  Left hand: Tenderness: Negative Erythema: negative Swelling: negative Capillary Refill: normal Thenar atrophy: positive Intrinsic wasting: negative Grip strength: fair to good grip strength Pincer strength: fair to good pincer strength Tinel`s test: positive Phalen`s test: positive Triggering: No gross triggering or locking of the digits Finkelstein`s test: negative Range of motion: Good range of motion of the digits  Neurologic: Awake, alert , and oriented. Sensory function is intact except for decreased discrimination to pinprick and light touch in a median nerve distribution. Motor strength is judged to be 5/5 except as noted above. No clonus or tremor. Motor coordination is within normal limits.  Impression: Left carpal tunnel syndrome  Plan: The findings were discussed in detail with the patient. The patient was given informational material on carpal tunnel release. Conservative treatment options  were reviewed with the patient. We discussed the risks and benefits of surgical intervention. The usual perioperative course was also discussed in detail. The patient expressed understanding of the risks and benefits of surgical intervention and would like to proceed with plans for left carpal tunnel release.  I spent  a total of 40 minutes in both face-to-face and non-face-to-face activities, excluding procedures performed, for this visit on the date of this encounter.  MEDICAL CLEARANCE: Per anesthesiology ACTIVITIES: As tolerated. WORK STATUS: Not applicable. THERAPY: None MEDICATIONS: Requested Prescriptions  No prescriptions requested or ordered in this encounter  FOLLOW-UP: Return for postoperative follow-up.  Inigo Lantigua P. Chenae Brager, Jr., M.D.  Electronically signed by Marilou Showman., MD at 11/02/2023 10:05 PM EDT

## 2023-11-06 NOTE — Discharge Instructions (Signed)
   Instructions after Hand / Wrist Surgery   Mardie Kellen P. Angie Fava., M.D.    Dept. of Orthopaedics & Sports Medicine [facility] [address] [place], [place]  [address]  Phone: [phone]   Fax: [phone]       [email or web address]       DIET: Drink plenty of non-alcoholic fluids & begin a light diet. Resume your normal diet the day after surgery.  ACTIVITY:  Keep the hand elevated above the level of the elbow. Begin gently moving the fingers on a regular basis to avoid stiffness. Avoid any heavy lifting, pushing, or pulling with the operative hand. Do not drive or operate any equipment until instructed.  WOUND CARE:  Keep the splint/bandage clean and dry.  The splint and stitches will be removed in the office. Continue to use the ice packs periodically to reduce pain and swelling. You may bathe or shower after the stitches are removed at the first office visit following surgery.  MEDICATIONS: You may resume your regular medications. Please take the pain medication as prescribed. Do not take pain medication on an empty stomach. Do not drive or drink alcoholic beverages when taking pain medications.  CALL THE OFFICE FOR: Temperature above 101 degrees Excessive bleeding or drainage on the dressing. Excessive swelling, coldness, or paleness of the fingers. Persistent nausea and vomiting.  FOLLOW-UP:  You should have an appointment to return to the office in 7-10 days after surgery.   REMEMBER: R.I.C.E. = Rest, Ice, Compression, Elevation !     [facility] Department Directory         [email or web address]       [email or web address]          Cardiology  Appointments: [place] - [phone]  Endocrinology  Appointments: [place] [phone] [place] - [phone]  Gastroenterology  Appointments: [place] [phone] [place] - [phone]        General  Surgery   Appointments: [facility]  Internal Medicine/Family Medicine  Appointments: [facility] - [phone] [place] - [phone]  Metabolic and Weigh Loss Surgery  Appointments: [facility]        Neurology  Appointments: [place] [phone] [place] - [phone]  Neurosurgery  Appointments: [place]  Obstetrics & Gynecology  Appointments: [place] [phone] [place] - [phone]        Pediatrics  Appointments: [facility] [phone] [place] - [phone]  Physiatry  Appointments: [place] [phone]  Physical Therapy  Appointments: [place] - [phone]        Podiatry  Appointments: [place] [phone] [place] - [phone]  Pulmonology  Appointments: [place]  Rheumatology  Appointments: [place] [phone]        [place] Location: [facility]  [address] [place], [place]  [address]  [place] Location: [facility] [address] [place], [place]  [address]  [place] Location: [facility] [address] [place], [place]  [address]

## 2023-11-11 ENCOUNTER — Encounter
Admission: RE | Admit: 2023-11-11 | Discharge: 2023-11-11 | Disposition: A | Discharge status: Home / Self Care | Source: Ambulatory Visit | Attending: Orthopedic Surgery | Admitting: Orthopedic Surgery

## 2023-11-11 ENCOUNTER — Other Ambulatory Visit: Payer: Self-pay

## 2023-11-11 DIAGNOSIS — D649 Anemia, unspecified: Secondary | ICD-10-CM

## 2023-11-11 DIAGNOSIS — I10 Essential (primary) hypertension: Secondary | ICD-10-CM

## 2023-11-11 DIAGNOSIS — Z01812 Encounter for preprocedural laboratory examination: Secondary | ICD-10-CM

## 2023-11-11 HISTORY — DX: Dyspnea, unspecified: R06.00

## 2023-11-11 NOTE — Patient Instructions (Addendum)
 Your procedure is scheduled on: 11/19/23 - Wednesday Report to the Registration Desk on the 1st floor of the Medical Mall. To find out your arrival time, please call [phone] between 1PM - 3PM on: 11/18/23 - Tuesday If your arrival time is 6:00 am, do not arrive before that time as the Medical Mall entrance doors do not open until 6:00 am.  REMEMBER: Instructions that are not followed completely may result in serious medical risk, up to and including death; or upon the discretion of your surgeon and anesthesiologist your surgery may need to be rescheduled.  Do not eat food after midnight the night before surgery.  No gum chewing or hard candies.  You may however, drink CLEAR liquids up to 2 hours before you are scheduled to arrive for your surgery. Do not drink anything within 2 hours of your scheduled arrival time.  Clear liquids include: - water  - apple juice without pulp - gatorade (not RED colors) - black coffee or tea (Do NOT add milk or creamers to the coffee or tea) Do NOT drink anything that is not on this list.  In addition, your doctor has ordered for you to drink the provided:  Ensure Pre-Surgery Clear Carbohydrate Drink  Drinking this carbohydrate drink up to two hours before surgery helps to reduce insulin resistance and improve patient outcomes. Please complete drinking 2 hours before scheduled arrival time.  One week prior to surgery: Stop beginning 06/25, Anti-inflammatories (NSAIDS) such as etodolac (LODINE) , Advil, Aleve, Ibuprofen, Motrin, Naproxen, Naprosyn and Aspirin based products such as Excedrin, Goody's Powder, BC Powder. You may take Tylenol  if needed for pain up until the day of surgery.  Stop beginning 06/25,  ANY OVER THE COUNTER supplements until after surgery.  Continue taking all of your other prescription medications up until the day before surgery.  ON THE DAY OF SURGERY ONLY TAKE THESE MEDICATIONS WITH SIPS OF WATER:  levothyroxine   (SYNTHROID )  omeprazole (PRILOSEC)    No Alcohol for 24 hours before or after surgery.  No Smoking including e-cigarettes for 24 hours before surgery.  No chewable tobacco products for at least 6 hours before surgery.  No nicotine patches on the day of surgery.  Do not use any recreational drugs for at least a week (preferably 2 weeks) before your surgery.  Please be advised that the combination of cocaine and anesthesia may have negative outcomes, up to and including death. If you test positive for cocaine, your surgery will be cancelled.  On the morning of surgery brush your teeth with toothpaste and water, you may rinse your mouth with mouthwash if you wish. Do not swallow any toothpaste or mouthwash.  Use CHG Soap or wipes as directed on instruction sheet.  Do not wear jewelry, make-up, hairpins, clips or nail polish.  For welded (permanent) jewelry: bracelets, anklets, waist bands, etc.  Please have this removed prior to surgery.  If it is not removed, there is a chance that hospital personnel will need to cut it off on the day of surgery.  Do not wear lotions, powders, or perfumes.   Do not shave body hair from the neck down 48 hours before surgery.  Contact lenses, hearing aids and dentures may not be worn into surgery.  Do not bring valuables to the hospital. [facility] is not responsible for any missing/lost belongings or valuables.   Notify your doctor if there is any change in your medical condition (cold, fever, infection).  Wear comfortable clothing (specific to  your surgery type) to the hospital.  After surgery, you can help prevent lung complications by doing breathing exercises.  Take deep breaths and cough every 1-2 hours. Your doctor may order a device called an Incentive Spirometer to help you take deep breaths.  When coughing or sneezing, hold a pillow firmly against your incision with both hands. This is called "splinting." Doing this helps protect your  incision. It also decreases belly discomfort.  If you are being admitted to the hospital overnight, leave your suitcase in the car. After surgery it may be brought to your room.  In case of increased patient census, it may be necessary for you, the patient, to continue your postoperative care in the Same Day Surgery department.  If you are being discharged the day of surgery, you will not be allowed to drive home. You will need a responsible individual to drive you home and stay with you for 24 hours after surgery.   If you are taking public transportation, you will need to have a responsible individual with you.  Please call the Pre-admissions Testing Dept. at [phone] if you have any questions about these instructions.  Surgery Visitation Policy:  Patients having surgery or a procedure may have two visitors.  Children under the age of 49 must have an adult with them who is not the patient.  Inpatient Visitation:    Visiting hours are 7 a.m. to 8 p.m. Up to four visitors are allowed at one time in a patient room. The visitors may rotate out with other people during the day.  One visitor age 74 or older may stay with the patient overnight and must be in the room by 8 p.m.   Merchandiser, retail to address health-related social needs:  [email or web address]     Preparing for Surgery with CHLORHEXIDINE  GLUCONATE (CHG) Soap  Chlorhexidine  Gluconate (CHG) Soap  o An antiseptic cleaner that kills germs and bonds with the skin to continue killing germs even after washing  o Used for showering the night before surgery and morning of surgery  Before surgery, you can play an important role by reducing the number of germs on your skin.  CHG (Chlorhexidine  gluconate) soap is an antiseptic cleanser which kills germs and bonds with the skin to continue killing germs even after washing.  Please do not use if you have an allergy to CHG or antibacterial soaps. If  your skin becomes reddened/irritated stop using the CHG.  1. Shower the NIGHT BEFORE SURGERY and the MORNING OF SURGERY with CHG soap.  2. If you choose to wash your hair, wash your hair first as usual with your normal shampoo.  3. After shampooing, rinse your hair and body thoroughly to remove the shampoo.  4. Use CHG as you would any other liquid soap. You can apply CHG directly to the skin and wash gently with a scrungie or a clean washcloth.  5. Apply the CHG soap to your body only from the neck down. Do not use on open wounds or open sores. Avoid contact with your eyes, ears, mouth, and genitals (private parts). Wash face and genitals (private parts) with your normal soap.  6. Wash thoroughly, paying special attention to the area where your surgery will be performed.  7. Thoroughly rinse your body with warm water.  8. Do not shower/wash with your normal soap after using and rinsing off the CHG soap.  9. Pat yourself dry with a clean towel.  10. Wear clean pajamas to bed  the night before surgery.  12. Place clean sheets on your bed the night of your first shower and do not sleep with pets.  13. Shower again with the CHG soap on the day of surgery prior to arriving at the hospital.  14. Do not apply any deodorants/lotions/powders.  15. Please wear clean clothes to the hospital.

## 2023-11-12 ENCOUNTER — Encounter
Admission: RE | Admit: 2023-11-12 | Discharge: 2023-11-12 | Disposition: A | Discharge status: Home / Self Care | Source: Ambulatory Visit | Attending: Orthopedic Surgery | Admitting: Orthopedic Surgery

## 2023-11-12 DIAGNOSIS — Z01818 Encounter for other preprocedural examination: Secondary | ICD-10-CM | POA: Insufficient documentation

## 2023-11-12 DIAGNOSIS — I498 Other specified cardiac arrhythmias: Secondary | ICD-10-CM | POA: Diagnosis not present

## 2023-11-12 DIAGNOSIS — I10 Essential (primary) hypertension: Secondary | ICD-10-CM | POA: Insufficient documentation

## 2023-11-12 DIAGNOSIS — D649 Anemia, unspecified: Secondary | ICD-10-CM | POA: Insufficient documentation

## 2023-11-12 DIAGNOSIS — R001 Bradycardia, unspecified: Secondary | ICD-10-CM | POA: Diagnosis not present

## 2023-11-12 LAB — BASIC METABOLIC PANEL WITH GFR
Anion gap: 10 (ref 5–15)
BUN: 22 mg/dL (ref 8–23)
CO2: 25 mmol/L (ref 22–32)
Calcium: 9.3 mg/dL (ref 8.9–10.3)
Chloride: 99 mmol/L (ref 98–111)
Creatinine, Ser: 0.8 mg/dL (ref 0.44–1.00)
GFR, Estimated: >60 mL/min (ref >60)
Glucose, Bld: 91 mg/dL (ref 70–99)
Potassium: 3.8 mmol/L (ref 3.5–5.1)
Sodium: 134 mmol/L — ABNORMAL LOW (ref 135–145)

## 2023-11-12 LAB — CBC
HCT: 42.1 % (ref 36.0–46.0)
Hemoglobin: 13.8 g/dL (ref 12.0–15.0)
MCH: 30.2 pg (ref 26.0–34.0)
MCHC: 32.8 g/dL (ref 30.0–36.0)
MCV: 92.1 fL (ref 80.0–100.0)
Platelets: 190 10*3/uL (ref 150–400)
RBC: 4.57 MIL/uL (ref 3.87–5.11)
RDW: 13.1 % (ref 11.5–15.5)
WBC: 4.7 10*3/uL (ref 4.0–10.5)
nRBC: 0 % (ref 0.0–0.2)

## 2023-11-16 ENCOUNTER — Encounter: Payer: Self-pay | Admitting: Orthopedic Surgery

## 2023-11-17 ENCOUNTER — Ambulatory Visit
Admission: RE | Admit: 2023-11-17 | Discharge: 2023-11-17 | Disposition: A | Discharge status: Home / Self Care | Payer: Medicare Other | Source: Ambulatory Visit | Attending: Physician Assistant | Admitting: Physician Assistant

## 2023-11-17 DIAGNOSIS — Z1231 Encounter for screening mammogram for malignant neoplasm of breast: Secondary | ICD-10-CM | POA: Insufficient documentation

## 2023-11-19 ENCOUNTER — Ambulatory Visit
Admission: RE | Admit: 2023-11-19 | Discharge: 2023-11-19 | Disposition: A | Discharge status: Home / Self Care | Source: Ambulatory Visit | Attending: Orthopedic Surgery | Admitting: Orthopedic Surgery

## 2023-11-19 ENCOUNTER — Encounter: Payer: Self-pay | Admitting: Orthopedic Surgery

## 2023-11-19 ENCOUNTER — Other Ambulatory Visit: Payer: Self-pay

## 2023-11-19 ENCOUNTER — Ambulatory Visit: Payer: Self-pay | Admitting: Urgent Care

## 2023-11-19 ENCOUNTER — Encounter
Admission: RE | Disposition: A | Discharge status: Home / Self Care | Payer: Self-pay | Source: Ambulatory Visit | Attending: Orthopedic Surgery

## 2023-11-19 DIAGNOSIS — E039 Hypothyroidism, unspecified: Secondary | ICD-10-CM | POA: Insufficient documentation

## 2023-11-19 DIAGNOSIS — Z79890 Hormone replacement therapy (postmenopausal): Secondary | ICD-10-CM | POA: Insufficient documentation

## 2023-11-19 DIAGNOSIS — G5602 Carpal tunnel syndrome, left upper limb: Secondary | ICD-10-CM | POA: Insufficient documentation

## 2023-11-19 DIAGNOSIS — I10 Essential (primary) hypertension: Secondary | ICD-10-CM | POA: Insufficient documentation

## 2023-11-19 DIAGNOSIS — K219 Gastro-esophageal reflux disease without esophagitis: Secondary | ICD-10-CM | POA: Diagnosis not present

## 2023-11-19 DIAGNOSIS — Z79899 Other long term (current) drug therapy: Secondary | ICD-10-CM | POA: Insufficient documentation

## 2023-11-19 HISTORY — PX: CARPAL TUNNEL RELEASE: SHX101

## 2023-11-19 SURGERY — CARPAL TUNNEL RELEASE
Anesthesia: General | Site: Wrist | Laterality: Left

## 2023-11-19 MED ORDER — METOCLOPRAMIDE HCL 5 MG/ML IJ SOLN: 5.0000 mg | Freq: Three times a day (TID) | INTRAMUSCULAR | Status: DC | PRN

## 2023-11-19 MED ORDER — ONDANSETRON HCL 4 MG PO TABS: 4.0000 mg | ORAL_TABLET | Freq: Four times a day (QID) | ORAL | Status: DC | PRN

## 2023-11-19 MED ORDER — PROPOFOL 500 MG/50ML IV EMUL
INTRAVENOUS | Status: DC | PRN
  Administered 2023-11-19: 100 ug/kg/min via INTRAVENOUS

## 2023-11-19 MED ORDER — BUPIVACAINE HCL (PF) 0.25 % IJ SOLN
INTRAMUSCULAR | Status: DC | PRN
Start: 2023-11-19 — End: 2023-11-19
  Administered 2023-11-19: 10 mL

## 2023-11-19 MED ORDER — PROPOFOL 1000 MG/100ML IV EMUL
INTRAVENOUS | Status: AC
  Filled 2023-11-19: qty 100

## 2023-11-19 MED ORDER — CELECOXIB 200 MG PO CAPS
ORAL_CAPSULE | ORAL | Status: AC
  Filled 2023-11-19: qty 2

## 2023-11-19 MED ORDER — ACETAMINOPHEN 325 MG PO TABS: 325.0000 mg | ORAL_TABLET | Freq: Four times a day (QID) | ORAL | Status: DC | PRN

## 2023-11-19 MED ORDER — LACTATED RINGERS IV SOLN: INTRAVENOUS | Status: DC

## 2023-11-19 MED ORDER — ONDANSETRON HCL 4 MG/2ML IJ SOLN
INTRAMUSCULAR | Status: DC | PRN
  Administered 2023-11-19: 4 mg via INTRAVENOUS

## 2023-11-19 MED ORDER — DEXAMETHASONE SODIUM PHOSPHATE 10 MG/ML IJ SOLN
INTRAMUSCULAR | Status: DC | PRN
  Administered 2023-11-19: 10 mg via INTRAVENOUS

## 2023-11-19 MED ORDER — 0.9 % SODIUM CHLORIDE (POUR BTL) OPTIME
TOPICAL | Status: DC | PRN
  Administered 2023-11-19: 500 mL

## 2023-11-19 MED ORDER — ORAL CARE MOUTH RINSE
15.0000 mL | Freq: Once | OROMUCOSAL | Status: AC
Start: 2023-11-19 — End: 2023-11-19

## 2023-11-19 MED ORDER — PROPOFOL 10 MG/ML IV BOLUS
INTRAVENOUS | Status: DC | PRN
  Administered 2023-11-19: 20 mg via INTRAVENOUS
  Administered 2023-11-19: 100 mg via INTRAVENOUS

## 2023-11-19 MED ORDER — CELECOXIB 200 MG PO CAPS
400.0000 mg | ORAL_CAPSULE | Freq: Once | ORAL | Status: AC
  Administered 2023-11-19: 400 mg via ORAL

## 2023-11-19 MED ORDER — ONDANSETRON HCL 4 MG/2ML IJ SOLN: 4.0000 mg | Freq: Four times a day (QID) | INTRAMUSCULAR | Status: DC | PRN

## 2023-11-19 MED ORDER — CEFAZOLIN SODIUM-DEXTROSE 2-4 GM/100ML-% IV SOLN
INTRAVENOUS | Status: AC
  Filled 2023-11-19: qty 100

## 2023-11-19 MED ORDER — ONDANSETRON HCL 4 MG/2ML IJ SOLN
INTRAMUSCULAR | Status: AC
  Filled 2023-11-19: qty 2

## 2023-11-19 MED ORDER — MORPHINE SULFATE (PF) 2 MG/ML IV SOLN: 0.5000 mg | INTRAVENOUS | Status: DC | PRN

## 2023-11-19 MED ORDER — LIDOCAINE HCL (PF) 2 % IJ SOLN
INTRAMUSCULAR | Status: AC
  Filled 2023-11-19: qty 5

## 2023-11-19 MED ORDER — HYDROCODONE-ACETAMINOPHEN 5-325 MG PO TABS: 1.0000 | ORAL_TABLET | Freq: Four times a day (QID) | ORAL | 0 refills | Status: AC | PRN

## 2023-11-19 MED ORDER — ACETAMINOPHEN 10 MG/ML IV SOLN
INTRAVENOUS | Status: DC | PRN
  Administered 2023-11-19: 1000 mg via INTRAVENOUS

## 2023-11-19 MED ORDER — CHLORHEXIDINE GLUCONATE 0.12 % MT SOLN
OROMUCOSAL | Status: AC
  Filled 2023-11-19: qty 15

## 2023-11-19 MED ORDER — FENTANYL CITRATE (PF) 100 MCG/2ML IJ SOLN
INTRAMUSCULAR | Status: AC
  Filled 2023-11-19: qty 2

## 2023-11-19 MED ORDER — LIDOCAINE HCL (CARDIAC) PF 100 MG/5ML IV SOSY
PREFILLED_SYRINGE | INTRAVENOUS | Status: DC | PRN
  Administered 2023-11-19: 60 mg via INTRAVENOUS

## 2023-11-19 MED ORDER — FENTANYL CITRATE (PF) 100 MCG/2ML IJ SOLN: 25.0000 ug | INTRAMUSCULAR | Status: DC | PRN

## 2023-11-19 MED ORDER — CEFAZOLIN SODIUM-DEXTROSE 2-4 GM/100ML-% IV SOLN
2.0000 g | INTRAVENOUS | Status: AC
  Administered 2023-11-19: 2 g via INTRAVENOUS

## 2023-11-19 MED ORDER — FENTANYL CITRATE (PF) 100 MCG/2ML IJ SOLN
INTRAMUSCULAR | Status: DC | PRN
  Administered 2023-11-19 (×4): 25 ug via INTRAVENOUS

## 2023-11-19 MED ORDER — HYDROCODONE-ACETAMINOPHEN 7.5-325 MG PO TABS: 1.0000 | ORAL_TABLET | ORAL | Status: DC | PRN

## 2023-11-19 MED ORDER — ACETAMINOPHEN 10 MG/ML IV SOLN
INTRAVENOUS | Status: AC
  Filled 2023-11-19: qty 100

## 2023-11-19 MED ORDER — DEXAMETHASONE SODIUM PHOSPHATE 10 MG/ML IJ SOLN
INTRAMUSCULAR | Status: AC
  Filled 2023-11-19: qty 1

## 2023-11-19 MED ORDER — BUPIVACAINE HCL (PF) 0.25 % IJ SOLN
INTRAMUSCULAR | Status: AC
Start: 2023-11-19 — End: 2023-11-19
  Filled 2023-11-19: qty 30

## 2023-11-19 MED ORDER — METOCLOPRAMIDE HCL 10 MG PO TABS: 5.0000 mg | ORAL_TABLET | Freq: Three times a day (TID) | ORAL | Status: DC | PRN

## 2023-11-19 MED ORDER — HYDROCODONE-ACETAMINOPHEN 5-325 MG PO TABS: 1.0000 | ORAL_TABLET | ORAL | Status: DC | PRN

## 2023-11-19 MED ORDER — CHLORHEXIDINE GLUCONATE 0.12 % MT SOLN
15.0000 mL | Freq: Once | OROMUCOSAL | Status: AC
  Administered 2023-11-19: 15 mL via OROMUCOSAL

## 2023-11-19 SURGICAL SUPPLY — 25 items
BNDG ELASTIC 3X5.8 VLCR NS LF (GAUZE/BANDAGES/DRESSINGS) ×1 IMPLANT
BNDG ESMARCH 4X12 STRL LF (GAUZE/BANDAGES/DRESSINGS) ×1 IMPLANT
CUFF TOURN SGL QUICK 18X4 (TOURNIQUET CUFF) ×1 IMPLANT
DRAPE U-SHAPE 47X51 STRL (DRAPES) ×1 IMPLANT
DURAPREP 26ML APPLICATOR (WOUND CARE) ×1 IMPLANT
ELECT CAUTERY BLADE 6.4 (BLADE) ×1 IMPLANT
ELECTRODE REM PT RTRN 9FT ADLT (ELECTROSURGICAL) ×1 IMPLANT
GAUZE SPONGE 4X4 12PLY STRL (GAUZE/BANDAGES/DRESSINGS) ×1 IMPLANT
GAUZE XEROFORM 1X8 LF (GAUZE/BANDAGES/DRESSINGS) ×1 IMPLANT
GLOVE BIO SURGEON STRL SZ7.5 (GLOVE) ×1 IMPLANT
GLOVE BIOGEL PI IND STRL 8 (GLOVE) ×2 IMPLANT
GOWN STRL REUS W/ TWL LRG LVL3 (GOWN DISPOSABLE) ×2 IMPLANT
KIT TURNOVER KIT A (KITS) ×1 IMPLANT
MANIFOLD NEPTUNE II (INSTRUMENTS) ×1 IMPLANT
NS IRRIG 500ML POUR BTL (IV SOLUTION) ×1 IMPLANT
PACK EXTREMITY ARMC (MISCELLANEOUS) ×1 IMPLANT
PAD CAST 3X4 CTTN HI CHSV (CAST SUPPLIES) ×1 IMPLANT
PENCIL SMOKE EVACUATOR (MISCELLANEOUS) ×1 IMPLANT
SOLUTION PREP PVP 2OZ (MISCELLANEOUS) ×1 IMPLANT
SPLINT CAST 1 STEP 3X12 (MISCELLANEOUS) ×1 IMPLANT
STOCKINETTE 48X4 2 PLY STRL (GAUZE/BANDAGES/DRESSINGS) ×1 IMPLANT
STOCKINETTE STRL 4IN 9604848 (GAUZE/BANDAGES/DRESSINGS) ×1 IMPLANT
SUT ETHILON 5-0 FS-2 18 BLK (SUTURE) ×1 IMPLANT
TRAP FLUID SMOKE EVACUATOR (MISCELLANEOUS) ×1 IMPLANT
WATER STERILE IRR 500ML POUR (IV SOLUTION) ×1 IMPLANT

## 2023-11-19 NOTE — Anesthesia Procedure Notes (Signed)
 Procedure Name: LMA Insertion Date/Time: 11/19/2023 5:00 PM  Performed by: Erie Jyl MATSU, CRNAPre-anesthesia Checklist: Patient identified, Patient being monitored, Timeout performed, Emergency Drugs available and Suction available Patient Re-evaluated:Patient Re-evaluated prior to induction Oxygen Delivery Method: Circle system utilized Preoxygenation: Pre-oxygenation with 100% oxygen Induction Type: IV induction Ventilation: Mask ventilation without difficulty LMA: LMA inserted LMA Size: 3.0 Tube type: Oral Number of attempts: 1 Placement Confirmation: positive ETCO2 and breath sounds checked- equal and bilateral Tube secured with: Tape Dental Injury: Teeth and Oropharynx as per pre-operative assessment

## 2023-11-19 NOTE — Op Note (Deleted)
 OPERATIVE NOTE  DATE OF SURGERY:  11/19/2023  PATIENT NAME:  Mackenzee Becvar   DOB: 02/10/1941  MRN: [id number]  PRE-OPERATIVE DIAGNOSIS: Degenerative arthrosis of the left knee, primary  POST-OPERATIVE DIAGNOSIS:  Same  PROCEDURE:  Left total knee arthroplasty using computer-assisted navigation  SURGEON:  Lynwood SHAUNNA Mardee Mickey. M.D.  ASSISTANT:  Sidra Koyanagi, PA-C (present and scrubbed throughout the case, critical for assistance with exposure, retraction, instrumentation, and closure)  ANESTHESIA: general  ESTIMATED BLOOD LOSS: 50 mL  FLUIDS REPLACED: 1000 mL of crystalloid  TOURNIQUET TIME: 88 minutes  DRAINS: 2 medium Hemovac drains  SOFT TISSUE RELEASES: Anterior cruciate ligament, posterior cruciate ligament, deep  medial collateral ligament, patellofemoral ligament  IMPLANTS UTILIZED: DePuy Attune size 5 posterior stabilized femoral component (cemented), size 5 rotating platform tibial component (cemented), 35 mm medialized dome patella (cemented), and a 5 mm stabilized rotating platform polyethylene insert.  INDICATIONS FOR SURGERY: Ginnifer Creelman is a 83 y.o. year old female with a long history of progressive knee pain. X-rays demonstrated severe degenerative changes in tricompartmental fashion. The patient had not seen any significant improvement despite conservative nonsurgical intervention. After discussion of the risks and benefits of surgical intervention, the patient expressed understanding of the risks benefits and agree with plans for total knee arthroplasty.   The risks, benefits, and alternatives were discussed at length including but not limited to the risks of infection, bleeding, nerve injury, stiffness, blood clots, the need for revision surgery, cardiopulmonary complications, among others, and they were willing to proceed.  PROCEDURE IN DETAIL: The patient was brought into the operating room and, after adequate general anesthesia was achieved, a tourniquet  was placed on the patient's upper thigh. The patient's knee and leg were cleaned and prepped with alcohol and DuraPrep and draped in the usual sterile fashion. A timeout was performed as per usual protocol. The lower extremity was exsanguinated using an Esmarch, and the tourniquet was inflated to 300 mmHg. An anterior longitudinal incision was made followed by a standard mid vastus approach. The deep fibers of the medial collateral ligament were elevated in a subperiosteal fashion off of the medial flare of the tibia so as to maintain a continuous soft tissue sleeve. The patella was subluxed laterally and the patellofemoral ligament was incised. Inspection of the knee demonstrated severe degenerative changes with full-thickness loss of articular cartilage. Osteophytes were debrided using a rongeur. Anterior and posterior cruciate ligaments were excised. Two 4.0 mm Schanz pins were inserted in the femur and into the tibia for attachment of the array of trackers used for computer-assisted navigation. Hip center was identified using a circumduction technique. Distal landmarks were mapped using the computer. The distal femur and proximal tibia were mapped using the computer. The distal femoral cutting guide was positioned using computer-assisted navigation so as to achieve a 5 distal valgus cut. The femur was sized and it was felt that a size 5 femoral component was appropriate. A size 5 femoral cutting guide was positioned and the anterior cut was performed and verified using the computer. This was followed by completion of the posterior and chamfer cuts. Femoral cutting guide for the central box was then positioned in the center box cut was performed.  Attention was then directed to the proximal tibia. Medial and lateral menisci were excised. The extramedullary tibial cutting guide was positioned using computer-assisted navigation so as to achieve a 0 varus-valgus alignment and 3 posterior slope. The cut was  performed and verified using the computer. The proximal  tibia was sized and it was felt that a size 5 tibial tray was appropriate. Tibial and femoral trials were inserted followed by insertion of a 5 mm polyethylene insert. This allowed for excellent mediolateral soft tissue balancing both in flexion and in full extension. Finally, the patella was cut and prepared so as to accommodate a 35 mm medialized dome patella. A patella trial was placed and the knee was placed through a range of motion with excellent patellar tracking appreciated. The femoral trial was removed after debridement of posterior osteophytes. The central post-hole for the tibial component was reamed followed by insertion of a keel punch. Tibial trials were then removed. Cut surfaces of bone were irrigated with copious amounts of normal saline using pulsatile lavage and then suctioned dry. Polymethylmethacrylate cement with gentamicin was prepared in the usual fashion using a vacuum mixer. Cement was applied to the cut surface of the proximal tibia as well as along the undersurface of a size 5 rotating platform tibial component. Tibial component was positioned and impacted into place. Excess cement was removed using Personal assistant. Cement was then applied to the cut surfaces of the femur as well as along the posterior flanges of the size 5 femoral component. The femoral component was positioned and impacted into place. Excess cement was removed using Personal assistant. A 5 mm polyethylene trial was inserted and the knee was brought into full extension with steady axial compression applied. Finally, cement was applied to the backside of a 35 mm medialized dome patella and the patellar component was positioned and patellar clamp applied. Excess cement was removed using Personal assistant. After adequate curing of the cement, the tourniquet was deflated after a total tourniquet time of 88 minutes. Hemostasis was achieved using electrocautery. The knee was  irrigated with copious amounts of normal saline using pulsatile lavage followed by 450 ml of Surgiphor and then suctioned dry. 20 mL of 1.3% Exparel  and 60 mL of 0.25% Marcaine  in 40 mL of normal saline was injected along the posterior capsule, medial and lateral gutters, and along the arthrotomy site. A 5 mm stabilized rotating platform polyethylene insert was inserted and the knee was placed through a range of motion with excellent mediolateral soft tissue balancing appreciated and excellent patellar tracking noted. 2 medium drains were placed in the wound bed and brought out through separate stab incisions. The medial parapatellar portion of the incision was reapproximated using interrupted sutures of #1 Vicryl. Subcutaneous tissue was approximated in layers using first #0 Vicryl followed #2-0 Vicryl. The skin was approximated with skin staples. A sterile dressing was applied.  The patient tolerated the procedure well and was transported to the recovery room in stable condition.    Erika Hussar P. Juwon Scripter, Jr., M.D.

## 2023-11-19 NOTE — Anesthesia Preprocedure Evaluation (Signed)
 Anesthesia Evaluation  Patient identified by MRN, date of birth, ID band Patient awake    Reviewed: Allergy & Precautions, NPO status , Patient's Chart, lab work & pertinent test results  History of Anesthesia Complications (+) PONV and history of anesthetic complications  Airway Mallampati: II  TM Distance: >3 FB Neck ROM: full    Dental  (+) Dental Advidsory Given   Pulmonary neg pulmonary ROS   Pulmonary exam normal        Cardiovascular Exercise Tolerance: Good hypertension, Pt. on medications (-) angina (-) Past MI and (-) Cardiac Stents Normal cardiovascular exam(-) dysrhythmias (-) Valvular Problems/Murmurs     Neuro/Psych  Headaches, neg Seizures 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  Neuromuscular disease (Right carpal tunnel syndrome)  negative psych ROS   GI/Hepatic Neg liver ROS,GERD  Medicated and Controlled,,  Endo/Other  Hypothyroidism    Renal/GU CRFRenal disease  negative genitourinary   Musculoskeletal  (+) Arthritis ,    Abdominal   Peds  Hematology negative hematology ROS (+)   Anesthesia Other Findings Past Medical History: No date: Anemia No date: Arthritis     Comment:  joints/ knees and back No date: Asymptomatic PVCs No date: Carpal tunnel syndrome of right wrist No date: Cataracts, bilateral     Comment:  a.) s/p extraction No date: 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 No date: Chronic kidney disease No date: GERD (gastroesophageal reflux disease) No date: Hyperlipidemia No date: Hypertension No date: Hypothyroidism No date: Migraines No date: PONV (postoperative nausea and vomiting) No date: Pre-diabetes 2000: Skin cancer of face No date: Subdural hematoma No date: Superficial thrombophlebitis No date: Venous insufficiency  Past Surgical History: 1948: APPENDECTOMY 09/29/2019: CATARACT EXTRACTION W/PHACO; Right     Comment:  Procedure: CATARACT  EXTRACTION PHACO AND INTRAOCULAR               LENS PLACEMENT (IOC) RIGHT 3.43 00:32.4 10.6%;  Surgeon:               Mittie Gaskin, MD;  Location: [facility];              Service: Ophthalmology;  Laterality: Right; 10/27/2019: CATARACT EXTRACTION W/PHACO; Left     Comment:  Procedure: CATARACT EXTRACTION PHACO AND INTRAOCULAR               LENS PLACEMENT (IOC) LEFT;  Surgeon: Mittie Gaskin, MD;  Location: [facility];  Service:               Ophthalmology;  Laterality: Left;  3.25 0:42.3 7.6% No date: COLONOSCOPY 09/24/2017: COLONOSCOPY WITH PROPOFOL ; N/A     Comment:  Procedure: COLONOSCOPY WITH PROPOFOL ;  Surgeon: Viktoria Lamar DASEN, MD;  Location: [facility];  Service:               Endoscopy;  Laterality: N/A; No date: DILATION AND CURETTAGE OF UTERUS No date: edg 06/2017: HERNIA REPAIR     Comment:  inguinal hernia 07/11/2017: INGUINAL HERNIA REPAIR; Right     Comment:  Procedure: HERNIA REPAIR INGUINAL ADULT;  Surgeon:               Claudene Larinda Bolder, MD;  Location: [facility];  Service:               General;  Laterality: Right; No  date: JOINT REPLACEMENT; Right     Comment:  Right knee replacement 12/10/2017: KNEE ARTHROPLASTY; Right     Comment:  Procedure: COMPUTER ASSISTED TOTAL KNEE ARTHROPLASTY;                Surgeon: Mardee Lynwood SQUIBB, MD;  Location: [facility];                Service: Orthopedics;  Laterality: Right; No date: KNEE ARTHROSCOPY; Right No date: skin cancer removed from face 2011: SUBDURAL HEMATOMA EVACUATION VIA CRANIOTOMY     Comment:  patient hit head with trunk. no residual effects     Reproductive/Obstetrics negative OB ROS                              Anesthesia Physical Anesthesia Plan  ASA: 2  Anesthesia Plan: General   Post-op Pain Management: Minimal or no pain anticipated   Induction: Intravenous  PONV Risk Score and Plan: Propofol  infusion and  TIVA  Airway Management Planned: LMA  Additional Equipment:   Intra-op Plan:   Post-operative Plan: Extubation in OR  Informed Consent: I have reviewed the patients History and Physical, chart, labs and discussed the procedure including the risks, benefits and alternatives for the proposed anesthesia with the patient or authorized representative who has indicated his/her understanding and acceptance.     Dental advisory given  Plan Discussed with: Anesthesiologist, CRNA and Surgeon  Anesthesia Plan Comments:          Anesthesia Quick Evaluation

## 2023-11-19 NOTE — Transfer of Care (Signed)
 Immediate Anesthesia Transfer of Care Note  Patient: Pamela Conley  Procedure(s) Performed: CARPAL TUNNEL RELEASE (Left: Wrist)  Patient Location: PACU  Anesthesia Type:General  Level of Consciousness: awake, alert , and oriented  Airway & Oxygen Therapy: Patient Spontanous Breathing  Post-op Assessment: Report given to RN and Post -op Vital signs reviewed and stable  Post vital signs: Reviewed and stable  Last Vitals:  Vitals Value Taken Time  BP 136/63 11/19/23 18:00  Temp    Pulse 54 11/19/23 18:01  Resp 16 11/19/23 18:01  SpO2 99 % 11/19/23 18:01  Vitals shown include unfiled device data.  Last Pain:  Vitals:   11/19/23 1514  TempSrc: Temporal  PainSc: 0-No pain         Complications: No notable events documented.

## 2023-11-19 NOTE — Progress Notes (Deleted)
 Patient is not able to walk the distance required to go the bathroom, or he/she is unable to safely negotiate stairs required to access the bathroom.  A 3in1 BSC will alleviate this problem   Amenda Duclos P. Angie Fava M.D.

## 2023-11-19 NOTE — H&P (View-Only) (Signed)
 Patient is not able to walk the distance required to go the bathroom, or he/she is unable to safely negotiate stairs required to access the bathroom.  A 3in1 BSC will alleviate this problem   Amenda Duclos P. Angie Fava M.D.

## 2023-11-19 NOTE — Interval H&P Note (Signed)
 History and Physical Interval Note:  11/19/2023 4:40 PM  Pamela Conley  has presented today for surgery, with the diagnosis of Carpal tunnel syndrome, left.  The various methods of treatment have been discussed with the patient and family. After consideration of risks, benefits and other options for treatment, the patient has consented to  Procedure(s): CARPAL TUNNEL RELEASE (Left) as a surgical intervention.  The patient's history has been reviewed, patient examined, no change in status, stable for surgery.  I have reviewed the patient's chart and labs.  Questions were answered to the patient's satisfaction.     Aaron Boeh P Zigmund Linse

## 2023-11-19 NOTE — Op Note (Signed)
 OPERATIVE NOTE  DATE OF SURGERY:  11/19/2023  PATIENT NAME:  Pamela Conley   DOB: [birth year]  MRN: [id number]  PRE-OPERATIVE DIAGNOSIS: Left carpal tunnel syndrome  POST-OPERATIVE DIAGNOSIS:  Same  PROCEDURE:  Left carpal tunnel release  SURGEON:  Lynwood SHAUNNA Mardee Mickey. M.D.  ANESTHESIA: general  ESTIMATED BLOOD LOSS: None  FLUIDS REPLACED: 450 mL of crystalloid  TOURNIQUET TIME: 30 minutes  DRAINS: None  INDICATIONS FOR SURGERY: Dezirea Mccollister is a 83 y.o. year old female with a long history of numbness and paresthesias to the left hand. EMG/nerve conduction studies demonstrated findings consistent with carpal tunnel syndrome.The patient had not seen any significant improvement despite conservative nonsurgical intervention. After discussion of the risks and benefits of surgical intervention, the patient expressed understanding of the risks benefits and agree with plans for carpal tunnel release.   PROCEDURE IN DETAIL: The patient was brought into the operating room and after adequate general anesthesia, a tourniquet was placed on the patient's left upper arm.The left hand and arm were prepped with alcohol and Duraprep and draped in the usual sterile fashion. A time-out was performed as per usual protocol. The hand and forearm were exsanguinated using an Esmarch and the tourniquet was inflated to 250 mmHg. Loupe magnification was used throughout the procedure. An incision was made just ulnar to the thenar palmar crease. Dissection was carried down through the palmar fascia to the transverse carpal ligament. The transverse carpal ligament was sharply incised, taking care to protect the underlying structures with the carpal tunnel. Complete release of the transverse carpal ligament was achieved. There was no evidence of ganglion cyst or lipoma within the carpal tunnel. The wound was irrigated with copious amounts of normal saline with antibiotic solution. The skin was then  re-approximated with interrupted sutures of #5-0 nylon. A sterile dressing was applied followed by application of a volar splint. The tourniquet was deflated with a total tourniquet time of 30 minutes.  The patient tolerated the procedure well and was transported to the PACU in stable condition.  Fidencio Duddy P. Milferd Ansell, Jr., M.D.

## 2023-11-20 ENCOUNTER — Encounter: Payer: Self-pay | Admitting: Orthopedic Surgery

## 2023-11-20 NOTE — Anesthesia Postprocedure Evaluation (Signed)
 Anesthesia Post Note  Patient: Pamela Conley  Procedure(s) Performed: CARPAL TUNNEL RELEASE (Left: Wrist)  Patient location during evaluation: PACU Anesthesia Type: General Level of consciousness: awake and alert Pain management: pain level controlled Vital Signs Assessment: post-procedure vital signs reviewed and stable Respiratory status: spontaneous breathing, nonlabored ventilation, respiratory function stable and patient connected to nasal cannula oxygen Cardiovascular status: blood pressure returned to baseline and stable Postop Assessment: no apparent nausea or vomiting Anesthetic complications: no   No notable events documented.   Last Vitals:  Vitals:   11/19/23 1831 11/19/23 1843  BP: (!) 159/73 (!) 166/68  Pulse: (!) 58   Resp: 13 16  Temp:    SpO2: 98% 99%    Last Pain:  Vitals:   11/19/23 1843  TempSrc:   PainSc: 0-No pain                 Prentice Murphy
